# Patient Record
Sex: Male | Born: 1970 | Race: White | Hispanic: No | Marital: Married | Smoking: Never smoker
Health system: Southern US, Community
[De-identification: ages and names within clinical notes are randomized; demographics above are authoritative.]

## PROBLEM LIST (undated history)

## (undated) DIAGNOSIS — I5021 Acute systolic (congestive) heart failure: Secondary | ICD-10-CM

## (undated) DIAGNOSIS — E7800 Pure hypercholesterolemia, unspecified: Secondary | ICD-10-CM

## (undated) DIAGNOSIS — I10 Essential (primary) hypertension: Secondary | ICD-10-CM

## (undated) HISTORY — DX: Acute systolic (congestive) heart failure: I50.21

## (undated) HISTORY — DX: Essential (primary) hypertension: I10

## (undated) HISTORY — PX: OTHER SURGICAL HISTORY: SHX169

## (undated) HISTORY — PX: BLADDER SURGERY: SHX569

---

## 2002-01-11 ENCOUNTER — Emergency Department (HOSPITAL_COMMUNITY): Admission: EM | Admit: 2002-01-11 | Discharge: 2002-01-11 | Payer: Self-pay | Admitting: Emergency Medicine

## 2002-01-11 ENCOUNTER — Encounter: Payer: Self-pay | Admitting: *Deleted

## 2004-03-18 ENCOUNTER — Ambulatory Visit (HOSPITAL_COMMUNITY): Admission: RE | Admit: 2004-03-18 | Discharge: 2004-03-18 | Payer: Self-pay | Admitting: Preventative Medicine

## 2004-12-16 ENCOUNTER — Emergency Department (HOSPITAL_COMMUNITY): Admission: EM | Admit: 2004-12-16 | Discharge: 2004-12-16 | Payer: Self-pay | Admitting: Emergency Medicine

## 2004-12-31 ENCOUNTER — Emergency Department (HOSPITAL_COMMUNITY): Admission: EM | Admit: 2004-12-31 | Discharge: 2004-12-31 | Payer: Self-pay | Admitting: Emergency Medicine

## 2006-01-26 ENCOUNTER — Emergency Department (HOSPITAL_COMMUNITY): Admission: EM | Admit: 2006-01-26 | Discharge: 2006-01-26 | Payer: Self-pay | Admitting: Emergency Medicine

## 2007-03-10 ENCOUNTER — Emergency Department: Payer: Self-pay | Admitting: Emergency Medicine

## 2010-06-30 ENCOUNTER — Emergency Department (HOSPITAL_COMMUNITY): Payer: Self-pay

## 2010-06-30 ENCOUNTER — Inpatient Hospital Stay (HOSPITAL_COMMUNITY)
Admission: EM | Admit: 2010-06-30 | Discharge: 2010-07-02 | Disposition: A | Discharge status: Other Acute Inpatient Hospital | Payer: Self-pay | Source: Home / Self Care

## 2010-06-30 DIAGNOSIS — I10 Essential (primary) hypertension: Secondary | ICD-10-CM | POA: Diagnosis present

## 2010-06-30 DIAGNOSIS — I5021 Acute systolic (congestive) heart failure: Secondary | ICD-10-CM | POA: Diagnosis present

## 2010-06-30 DIAGNOSIS — E785 Hyperlipidemia, unspecified: Secondary | ICD-10-CM | POA: Diagnosis present

## 2010-06-30 DIAGNOSIS — E669 Obesity, unspecified: Secondary | ICD-10-CM | POA: Diagnosis present

## 2010-06-30 DIAGNOSIS — Z79899 Other long term (current) drug therapy: Secondary | ICD-10-CM

## 2010-06-30 DIAGNOSIS — Z91199 Patient's noncompliance with other medical treatment and regimen due to unspecified reason: Secondary | ICD-10-CM

## 2010-06-30 DIAGNOSIS — Z9119 Patient's noncompliance with other medical treatment and regimen: Secondary | ICD-10-CM

## 2010-06-30 DIAGNOSIS — Z7982 Long term (current) use of aspirin: Secondary | ICD-10-CM

## 2010-06-30 DIAGNOSIS — I509 Heart failure, unspecified: Secondary | ICD-10-CM | POA: Diagnosis present

## 2010-06-30 DIAGNOSIS — E876 Hypokalemia: Secondary | ICD-10-CM | POA: Diagnosis present

## 2010-06-30 DIAGNOSIS — I428 Other cardiomyopathies: Secondary | ICD-10-CM | POA: Diagnosis present

## 2010-06-30 DIAGNOSIS — Z889 Allergy status to unspecified drugs, medicaments and biological substances status: Secondary | ICD-10-CM

## 2010-06-30 DIAGNOSIS — I5023 Acute on chronic systolic (congestive) heart failure: Principal | ICD-10-CM | POA: Diagnosis present

## 2010-06-30 LAB — POCT CARDIAC MARKERS
CKMB, poc: <1 ng/mL — ABNORMAL LOW (ref 1.0–8.0)
Myoglobin, poc: 61.2 ng/mL (ref 12–200)
Troponin i, poc: <0.05 ng/mL (ref 0.00–0.09)

## 2010-06-30 LAB — BASIC METABOLIC PANEL
CO2: 24 mEq/L (ref 19–32)
Calcium: 8.7 mg/dL (ref 8.4–10.5)
Chloride: 105 mEq/L (ref 96–112)
Creatinine, Ser: 0.92 mg/dL (ref 0.4–1.5)
Glucose, Bld: 130 mg/dL — ABNORMAL HIGH (ref 70–99)

## 2010-06-30 LAB — CBC
HCT: 46.4 % (ref 39.0–52.0)
MCH: 31.1 pg (ref 26.0–34.0)
MCV: 91.9 fL (ref 78.0–100.0)
Platelets: 170 10*3/uL (ref 150–400)
RDW: 13.8 % (ref 11.5–15.5)

## 2010-06-30 LAB — DIFFERENTIAL
Eosinophils Absolute: 0.2 10*3/uL (ref 0.0–0.7)
Eosinophils Relative: 2 % (ref 0–5)
Lymphocytes Relative: 25 % (ref 12–46)
Lymphs Abs: 2.2 10*3/uL (ref 0.7–4.0)
Monocytes Absolute: 0.5 10*3/uL (ref 0.1–1.0)
Monocytes Relative: 6 % (ref 3–12)

## 2010-06-30 MED ORDER — IOHEXOL 350 MG/ML SOLN
100.0000 mL | Freq: Once | INTRAVENOUS | Status: AC | PRN
  Administered 2010-06-30: 100 mL via INTRAVENOUS

## 2010-07-01 ENCOUNTER — Inpatient Hospital Stay (HOSPITAL_COMMUNITY): Payer: Self-pay

## 2010-07-01 DIAGNOSIS — R0602 Shortness of breath: Secondary | ICD-10-CM

## 2010-07-01 DIAGNOSIS — R079 Chest pain, unspecified: Secondary | ICD-10-CM

## 2010-07-01 DIAGNOSIS — I517 Cardiomegaly: Secondary | ICD-10-CM

## 2010-07-01 LAB — URINALYSIS, ROUTINE W REFLEX MICROSCOPIC
Bilirubin Urine: NEGATIVE
Glucose, UA: NEGATIVE mg/dL
Ketones, ur: NEGATIVE mg/dL
Protein, ur: NEGATIVE mg/dL
pH: 6 (ref 5.0–8.0)

## 2010-07-01 LAB — LIPID PANEL
LDL Cholesterol: 140 mg/dL — ABNORMAL HIGH (ref 0–99)
Total CHOL/HDL Ratio: 7.3 RATIO
Triglycerides: 144 mg/dL (ref <150)
VLDL: 29 mg/dL (ref 0–40)

## 2010-07-01 LAB — CBC
HCT: 43.1 % (ref 39.0–52.0)
Hemoglobin: 14.8 g/dL (ref 13.0–17.0)
MCH: 30.6 pg (ref 26.0–34.0)
MCV: 89 fL (ref 78.0–100.0)
Platelets: 162 10*3/uL (ref 150–400)
RBC: 4.84 MIL/uL (ref 4.22–5.81)
WBC: 10.8 10*3/uL — ABNORMAL HIGH (ref 4.0–10.5)

## 2010-07-01 LAB — T4, FREE: Free T4: 1.01 ng/dL (ref 0.80–1.80)

## 2010-07-01 LAB — RAPID URINE DRUG SCREEN, HOSP PERFORMED
Benzodiazepines: POSITIVE — AB
Cocaine: NOT DETECTED
Opiates: NOT DETECTED

## 2010-07-01 LAB — COMPREHENSIVE METABOLIC PANEL
Albumin: 3.7 g/dL (ref 3.5–5.2)
Alkaline Phosphatase: 50 U/L (ref 39–117)
BUN: 12 mg/dL (ref 6–23)
CO2: 29 mEq/L (ref 19–32)
Chloride: 103 mEq/L (ref 96–112)
Creatinine, Ser: 1.27 mg/dL (ref 0.4–1.5)
GFR calc non Af Amer: >60 mL/min (ref >60)
Potassium: 3.9 mEq/L (ref 3.5–5.1)
Total Bilirubin: 1.2 mg/dL (ref 0.3–1.2)

## 2010-07-01 LAB — BLOOD GAS, ARTERIAL
Bicarbonate: 23.4 mEq/L (ref 20.0–24.0)
O2 Saturation: 92.2 %
Patient temperature: 37
TCO2: 20 mmol/L (ref 0–100)
pH, Arterial: 7.39 (ref 7.350–7.450)
pO2, Arterial: 69.2 mmHg — ABNORMAL LOW (ref 80.0–100.0)

## 2010-07-01 LAB — T3, FREE: T3, Free: 3.4 pg/mL (ref 2.3–4.2)

## 2010-07-01 LAB — DIFFERENTIAL
Lymphocytes Relative: 20 % (ref 12–46)
Lymphs Abs: 2.2 10*3/uL (ref 0.7–4.0)
Monocytes Relative: 8 % (ref 3–12)
Neutro Abs: 7.7 10*3/uL (ref 1.7–7.7)
Neutrophils Relative %: 71 % (ref 43–77)

## 2010-07-01 LAB — HEMOGLOBIN A1C: Hgb A1c MFr Bld: 6.1 % — ABNORMAL HIGH (ref <5.7)

## 2010-07-01 LAB — CARDIAC PANEL(CRET KIN+CKTOT+MB+TROPI)
CK, MB: 1.4 ng/mL (ref 0.3–4.0)
CK, MB: 1.3 ng/mL (ref 0.3–4.0)
Relative Index: INVALID (ref 0.0–2.5)
Relative Index: INVALID (ref 0.0–2.5)
Total CK: 74 U/L (ref 7–232)
Total CK: 70 U/L (ref 7–232)

## 2010-07-01 LAB — MAGNESIUM: Magnesium: 2.1 mg/dL (ref 1.5–2.5)

## 2010-07-01 LAB — URINE MICROSCOPIC-ADD ON

## 2010-07-02 ENCOUNTER — Inpatient Hospital Stay (HOSPITAL_COMMUNITY)
Admission: RE | Admit: 2010-07-02 | Discharge: 2010-07-04 | DRG: 287 | Disposition: A | Discharge status: Home / Self Care | Payer: Self-pay | Source: Other Acute Inpatient Hospital | Attending: Internal Medicine | Admitting: Internal Medicine

## 2010-07-02 DIAGNOSIS — I5021 Acute systolic (congestive) heart failure: Secondary | ICD-10-CM

## 2010-07-02 LAB — LIPID PANEL
Triglycerides: 154 mg/dL — ABNORMAL HIGH (ref <150)
VLDL: 31 mg/dL (ref 0–40)

## 2010-07-02 LAB — BASIC METABOLIC PANEL
BUN: 15 mg/dL (ref 6–23)
CO2: 27 mEq/L (ref 19–32)
Calcium: 9.1 mg/dL (ref 8.4–10.5)
GFR calc non Af Amer: >60 mL/min (ref >60)
Glucose, Bld: 117 mg/dL — ABNORMAL HIGH (ref 70–99)
Sodium: 139 mEq/L (ref 135–145)

## 2010-07-02 LAB — TSH: TSH: 1.046 u[IU]/mL (ref 0.350–4.500)

## 2010-07-02 LAB — HIV ANTIBODY (ROUTINE TESTING W REFLEX): HIV: NONREACTIVE

## 2010-07-02 NOTE — Group Therapy Note (Signed)
  NAME:  Bryan Clark, Bryan Clark NO.:  [id number]  MEDICAL RECORD NO.:  [id number]           PATIENT TYPE:  I  LOCATION:  A336                          FACILITY:  [facility]  PHYSICIAN:  Wilson Singer, M.D.DATE OF BIRTH:  [birth year]  DATE OF PROCEDURE:  07/01/2010 DATE OF DISCHARGE:                                PROGRESS NOTE   This 40 year old man was admitted yesterday with approximately an 1- month history of dyspnea on exertion, 2-week history of orthopnea, and also episodes of paroxysmal nocturnal dyspnea.  He also describes some chest tightening and pressure.  He does have a history of hypertension going back 2-3 years ago, and he was seeing Dr. Renaye Rakers in [place].  He was put on antihypertensive medications, but because of lack of insurance, he and his wife were no longer able to afford medications, and he has not been taking any medications for 1-1/2 years now.  He was admitted with congestive heart failure, which I am fairly sure diastolic in nature and has been diuresed.  He feels better with his breathing now and serial cardiac enzymes so far have been negative. Electrocardiogram shows normal sinus rhythm with poor R-wave progression, and I believe evidence of probably left ventricular hypertrophy.  He does have a family history of hypertension in his mother and his sister.  He has never had any coronary artery disease and stroke.  PHYSICAL EXAMINATION:  VITAL SIGNS:  Temperature 97.9, blood pressure 131/83, pulse 68, saturation 95% on room air. GENERAL:  He looks systemically well.  He is not in any respiratory distress.  There is no increased work of breathing.  CARDIOVASCULAR: Heart sounds are present without any gallop rhythm.  Jugular venous pressure is not raised. LUNGS:  There is no peripheral pitting edema.  Lung fields show some inspiratory crackles at both bases.  He clinically does not have any pleural effusions, although on a CT chest  angiogram yesterday he did, and I think these are probably resolving now.  There is no peripheral pitting edema.  Investigations today showed a hemoglobin of 14.8, white blood cell count 10.8, platelets 162.  Sodium 140, potassium 3.9, bicarbonate 29, BUN 12, creatinine 1.27.  BNP 430.  Troponins so far less than 0.05 and 0.05.  IMPRESSION: 1. Congestive heart failure, likely diastolic. 2. Obesity.  The patient is currently about 70 pounds overweight     compared to his baseline.  PLAN: 1. I will switch him now to p.o. Lasix. 2. Start amlodipine as a calcium channel blockers medication of choice     for his hypertension and diastolic heart failure. 3. Await echocardiogram report. 4. Possible discharge home tomorrow. We talked at length about diet and exercise and the real urgency for this man to lose at least 50 pounds over the next year or so.  We talked about strategies of doing this.     Wilson Singer, M.D.     NCG/MEDQ  D:  07/01/2010  T:  07/01/2010  Job:  [id number]  Electronically Signed by Lilly Cove M.D. on 07/02/2010 08:08:12 AM

## 2010-07-03 DIAGNOSIS — I509 Heart failure, unspecified: Secondary | ICD-10-CM

## 2010-07-03 LAB — BASIC METABOLIC PANEL
CO2: 29 mEq/L (ref 19–32)
Calcium: 9.2 mg/dL (ref 8.4–10.5)
Creatinine, Ser: 1.38 mg/dL (ref 0.4–1.5)
GFR calc Af Amer: >60 mL/min (ref >60)
GFR calc non Af Amer: 57 mL/min — ABNORMAL LOW (ref >60)
Glucose, Bld: 128 mg/dL — ABNORMAL HIGH (ref 70–99)
Sodium: 138 mEq/L (ref 135–145)

## 2010-07-03 LAB — POCT I-STAT 3, ART BLOOD GAS (G3+)
Acid-Base Excess: 1 mmol/L (ref 0.0–2.0)
O2 Saturation: 93 %
pCO2 arterial: 41 mmHg (ref 35.0–45.0)
pO2, Arterial: 68 mmHg — ABNORMAL LOW (ref 80.0–100.0)

## 2010-07-03 LAB — POCT I-STAT 3, VENOUS BLOOD GAS (G3P V)
pCO2, Ven: 45 mmHg (ref 45.0–50.0)
pH, Ven: 7.335 — ABNORMAL HIGH (ref 7.250–7.300)
pO2, Ven: 32 mmHg (ref 30.0–45.0)

## 2010-07-03 LAB — PROTIME-INR: Prothrombin Time: 14.6 seconds (ref 11.6–15.2)

## 2010-07-04 DIAGNOSIS — I5023 Acute on chronic systolic (congestive) heart failure: Secondary | ICD-10-CM

## 2010-07-04 LAB — BASIC METABOLIC PANEL
CO2: 27 mEq/L (ref 19–32)
Chloride: 100 mEq/L (ref 96–112)
GFR calc Af Amer: >60 mL/min (ref >60)
Potassium: 3.5 mEq/L (ref 3.5–5.1)

## 2010-07-04 NOTE — Cardiovascular Report (Signed)
  NAME:  Bryan Clark, Bryan Clark NO.:  [id number]  MEDICAL RECORD NO.:  [id number]           PATIENT TYPE:  I  LOCATION:  3708                         FACILITY:  [facility]  PHYSICIAN:  Noralyn Pick. Eden Emms, MD, FACCDATE OF BIRTH:  [birth year]  DATE OF PROCEDURE: DATE OF DISCHARGE:                           CARDIAC CATHETERIZATION   PROCEDURE:  Coronary arteriography.  INDICATION:  Congestive heart failure.  Standard catheterization was done from the right femoral artery and vein.  Left main coronary artery was normal.  Left anterior descending artery was normal.  There were two very small diagonal branches, which were normal.  Circumflex coronary artery was nondominant.  He had an extremely large branching first obtuse marginal branch, which also supplied the anterolateral wall.  There was a single AV groove branch.  Circumflex coronary artery was normal.  The right coronary artery was dominant and normal.  RAO ventriculography:  RAO ventriculography showed global hypokinesis with severe left ventricular cavity enlargement.  There was mild MR, ejection fraction was in the 20% range.  Right heart catheterization was done due to clinical heart failure.  Mean right atrial pressure was 13.  RV pressure was 49/13, PA pressure was 49/27 with a mean of 34.  Mean pulmonary capillary wedge pressure was 32.  LV pressure was 130/24 with a post A-wave EDP of 31.  Aortic pressure was 130/89.  IMPRESSION:  The patient has severe nonischemic cardiomyopathy.  He still has somewhat high filling pressures.  Continued aggressive medical therapy is warranted.  The patient tolerated the procedure well.     Noralyn Pick. Eden Emms, MD, [facility]     PCN/MEDQ  D:  07/03/2010  T:  07/03/2010  Job:  [id number]  cc:   [facility] Gerrit Friends. Dietrich Pates, MD, [facility]  Electronically Signed by Charlton Haws MD [facility] on 07/04/2010 11:31:17 AM

## 2010-07-10 NOTE — Discharge Summary (Signed)
NAME:  Bryan Clark, Bryan Clark NO.:  [id number]  MEDICAL RECORD NO.:  [id number]           PATIENT TYPE:  I  LOCATION:  3708                         FACILITY:  [facility]  PHYSICIAN:  Hartley Barefoot, MD    DATE OF BIRTH:  June 23, 1970  DATE OF ADMISSION:  07/02/2010 DATE OF DISCHARGE:  07/04/2010                              DISCHARGE SUMMARY   DISCHARGE DIAGNOSES: 1. Nonischemic cardiomyopathy, ejection fraction 20% by cardiac cath. 2. Hypertensive urgency. 3. Acute systolic heart failure exacerbation. 4. Bilateral pleural effusion, likely secondary to heart failure. 5. Hyperlipidemia.  DISCHARGE MEDICATIONS: 1. Aspirin 81 mg p.o. daily. 2. Carvedilol 12.5 mg p.o. b.i.d. 3. Lasix 40 mg p.o. daily. 4. Lisinopril 10 mg p.o. daily. 5. Potassium 20 mEq 1 tablet by mouth daily. 6. Spironolactone 25 mg p.o. daily.  DISPOSITION AND FOLLOWUP:  Mr. Maiello will need to follow up with [facility] Cardiology at their [place] office next week.  He need to have a BMET to follow potassium level and renal function now that he is on his spironolactone, Lasix, and ACE.  He will need also repeat 2-D echo in 12 weeks to evaluate ejection fraction, may need to consider ICD if ejection fraction less than 35%.  PROCEDURE PERFORMED:  Cardiac cath showed severe nonischemic cardiomyopathy.  BRIEF HISTORY OF PRESENT ILLNESS:  This is a 40 year old with past medical history of hypertension who was admitted at [facility] on June 30, 2010, for shortness of breath and chest pain.  He has been having chest pain and shortness of breath for over a month, progressively got worse.  HOSPITAL COURSE: 1. Acute Systolic heart failure exacerbation. The patient was admitted to     [facility] telemetry. Cardiology was consulted. He was started     on Lasix spironolactone and ACE inhibitors.     He had a 2-D echo that showed systolic heart failure with ejection     fraction of 35%.  He was then  transferred to [facility] for cardiac     cath and for evaluation.  Cardiac cath showed nonischemic     cardiomyopathy, ejection fraction of 20%.  The patient now is     euvolemic, no chest pain or shortness of breath.  He will need to     be on aggressive medical management.  He will be discharged on     Coreg, lisinopril, and Aldactone.  He will need a 2-D echo in 12     weeks to evaluate ejection fraction and consider ICD placement.     He will need to follow with Cardiology at [facility] next week for     a BMET. 2. Hypertensive urgency.  The patient was not taking his medication.     After he was started on oral medication, his blood pressure has     been well controlled. 3. Bilateral pleural effusion.  This was likely secondary to heart     failure.  He will need a chest x-ray to follow resolution of the     effusion after aggressive diuresis.  On the day of discharge, the patient was in improved condition.  Blood pressure 120/81, saturation 97 on room air, respirations 19, pulse 76, and temperature 98.0.  DISCHARGE LABORATORY DATA:  Sodium 140, potassium 3.5, chloride 100, bicarb 27, glucose 97, BUN 18, and creatinine 1.5.  TSH was normal at 1.0.  LDL was 141.  HIV was negative.  The patient was discharged in improved condition.  The patient to follow up with Dr. Parke Simmers.     Hartley Barefoot, MD     BR/MEDQ  D:  07/04/2010  T:  07/05/2010  Job:  [id number]  cc:   Renaye Rakers, M.D.  Electronically Signed by Hartley Barefoot MD on 07/10/2010 07:23:19 PM

## 2010-07-11 ENCOUNTER — Encounter: Payer: Self-pay | Admitting: Adult Health

## 2010-07-11 ENCOUNTER — Ambulatory Visit (INDEPENDENT_AMBULATORY_CARE_PROVIDER_SITE_OTHER): Payer: Self-pay | Admitting: Adult Health

## 2010-07-11 DIAGNOSIS — I10 Essential (primary) hypertension: Secondary | ICD-10-CM

## 2010-07-11 DIAGNOSIS — E781 Pure hyperglyceridemia: Secondary | ICD-10-CM

## 2010-07-11 DIAGNOSIS — I5022 Chronic systolic (congestive) heart failure: Secondary | ICD-10-CM

## 2010-07-11 DIAGNOSIS — I428 Other cardiomyopathies: Secondary | ICD-10-CM

## 2010-07-11 DIAGNOSIS — E7800 Pure hypercholesterolemia, unspecified: Secondary | ICD-10-CM

## 2010-07-11 MED ORDER — CARVEDILOL 12.5 MG PO TABS: 18.7500 mg | ORAL_TABLET | Freq: Two times a day (BID) | ORAL | Status: DC

## 2010-07-11 NOTE — Patient Instructions (Signed)
Your physician recommends that you schedule a follow-up appointment in:1 month Your physician has recommended you make the following change in your medication: carvedilol 12.5mg  take 1 1/2 tablets by mouth twice daily Low salt diet: handout Congestive heart failure: handout Your physician recommends that you weigh, daily, at the same time every day, and in the same amount of clothing. Please record your daily weights on the handout provided and bring it to your next appointment.

## 2010-07-11 NOTE — Assessment & Plan Note (Signed)
Will recheck this in 3 months.  If elevated after diet and lifestyle changes will begin statin.

## 2010-07-11 NOTE — Progress Notes (Signed)
HPI:  40 y/o CM we are following after recent consultation at [facility] and subsequent transfer to [facility], after admission for progressive dyspnea.  He has a history of hypertension, hyperlipidemia, and medical noncompliance secondary to financial issues. During [facility] admission an echo was obtained and he was found to have EF of 35%.  He was transferred to [facility] for cardiac catheterization which demonstrated normal coronary arteries but severe nonischemic cardiomyopathy.  He was subsequently placed on ASA 81mg  daily, coreg 12.5mg  BID, Lasix 40mg  daily, Lisinopril 10mg  daily, spironolactone 25mg  daily and potassium daily.  He is without complaint today. He has been complaint with medcations. He has lost a total of 14lbs with diureses during hospitalization.  He is following a low salt diet. He is not weighing himself daily at this time.    No Known Allergies  Current Outpatient Prescriptions  Medication Sig Dispense Refill  . aspirin 81 MG tablet Take 81 mg by mouth daily.        . carvedilol (COREG) 12.5 MG tablet Take 1.5 tablets (18.75 mg total) by mouth 2 (two) times daily with a meal.  45 tablet  3  . furosemide (LASIX) 40 MG tablet Take 40 mg by mouth daily.        Marland Kitchen lisinopril (PRINIVIL,ZESTRIL) 10 MG tablet Take 10 mg by mouth daily.        . potassium chloride SA (K-DUR,KLOR-CON) 20 MEQ tablet Take 20 mEq by mouth daily.        Marland Kitchen spironolactone (ALDACTONE) 25 MG tablet Take 25 mg by mouth daily.        Marland Kitchen DISCONTD: carvedilol (COREG) 12.5 MG tablet Take 12.5 mg by mouth 2 (two) times daily with a meal.          Past Medical History  Diagnosis Date  . Cardiomyopathy     nonischemic ejection fraction 20% by cardiac cath  . Hypertension   . Acute systolic heart failure     exacerbation    Past Surgical History  Procedure Date  . Bilaterial pleural effusion   . Bladder surgery     trauma as a child during martial arts training    ROS: Review of systems complete and found to be negative  unless listed above PHYSICAL EXAM BP 103/74  Pulse 66  Wt 225 lb (102.059 kg)  SpO2 96% General: Well developed, well nourished, in no acute distress Head: Eyes PERRLA, No xanthomas.   Normal cephalic and atramatic  Lungs: Clear bilaterally to auscultation and percussion. Heart: HRRR S1 S2  Pulses are 2+ & equal.            No carotid bruit. No JVD.  No abdominal bruits. No femoral bruits. Abdomen: Bowel sounds are positive, abdomen soft and non-tender without masses or                  Hernia's noted. Msk:  Back normal, normal gait. Normal strength and tone for age. Extremities: No clubbing, cyanosis or edema.  DP +1 Neuro: Alert and oriented X 3. Psych:  Good affect, responds appropriately

## 2010-07-11 NOTE — Assessment & Plan Note (Signed)
Currently modestly controlled with low EF.  I would like to see systolic in the 90's, no higher than 100.  I will increase his coreg to 18.75 mg BID.

## 2010-07-11 NOTE — Assessment & Plan Note (Signed)
He is currently without complaints. Wt is down from 107 kg to 102 kg.  He is walking everyday.  He denies symptoms. He is complaint with medications.  I have advised him to buy a digital scale, weigh daily at the same time every day. He is given instructions on a low salt diet.  He is to call us for weight gain of 2-3 lbs in 1-2 days or 5 lbs in one week.  I will check a BMET to evaluate kidney fx with use of ACE inhibitor, spironolactone, lasix and potassium.  Coreg will be increased to 18.75 mg BID.  He will see [place] in one month.

## 2010-07-12 LAB — BASIC METABOLIC PANEL
BUN: 28 mg/dL — ABNORMAL HIGH (ref 6–23)
CO2: 25 mEq/L (ref 19–32)
Chloride: 100 mEq/L (ref 96–112)
Glucose, Bld: 93 mg/dL (ref 70–99)
Potassium: 4.9 mEq/L (ref 3.5–5.3)
Sodium: 137 mEq/L (ref 135–145)
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
## 2010-07-20 NOTE — H&P (Signed)
NAME:  Bryan Clark, Bryan Clark NO.:  [id number]  MEDICAL RECORD NO.:  [id number]           PATIENT TYPE:  E  LOCATION:  [facility]                          FACILITY:  [facility]  PHYSICIAN:  Eduard Clos, MDDATE OF BIRTH:  11/30/1970  DATE OF ADMISSION:  06/30/2010 DATE OF DISCHARGE:  LH                             HISTORY & PHYSICAL   PRIMARY CARE PHYSICIAN:  Unassigned.  The patient used to go to Dr. Renaye Rakers, has not gone for the last 1-1/2 years.  CHIEF COMPLAINT:  Shortness of breath and chest pain.  HISTORY OF PRESENT ILLNESS:  A 40 year old male with previous history of hypertension, hyperlipidemia, has not been taking his medications for a year and a half because of financial issues.  He has been experiencing shortness of breath and chest pain over the last 1 month.  The patient usually works in the third shift in the evenings and whenever he goes to start working, he starts developing some chest pain which is retrosternal and radiates across the chest.  It is a pressure-like. Along with that, he also started developing shortness of breath.  Last night he became suddenly short of breath and he was sleeping and had to wake up in the middle of the sleep gasping for breath which actually brought him to the ER.  In the ER, the patient had a CT angio of chest which showed a bilateral pleural effusion, right more than left.  I did discuss with radiologist who said the effusion is small to moderate, not huge.  At this time, the patient has been admitted for chest pain, shortness of breath, and possible decompensated CHF.  The patient denies any nausea, vomiting, abdominal pain, dysuria, discharge, diarrhea, any headache, any focal deficits, loss of consciousness.  He does get short of breath when he walks and also when he lies down flat for long time.  He has not noticed any swelling in his legs.  Denies any fever or chills.  Does have dry cough.  No phlegm.  PAST  MEDICAL HISTORY:  Hypertension, hyperlipidemia, obesity.  PAST SURGICAL HISTORY:  He has had surgery for his bladder from a trauma as a child during martial art training.  MEDICATIONS ON ADMISSION:  None.  He used to be on Azor a year and a half ago along with TriCor.  ALLERGIES:  No known drug allergies.  FAMILY HISTORY:  Significant for hypertension and diabetes.  SOCIAL HISTORY:  The patient works as a Electrical engineer.  He is a full code.  He is married.  Denies any serious smoking, drinking alcohol, or abusing drugs.  REVIEW OF SYSTEMS:  As per history of present illness, nothing else significant.  PHYSICAL EXAMINATION:  GENERAL:  The patient examined at bedside, not in acute distress. VITAL SIGNS:  Blood pressure is 180/120, pulse is around 115 per minute, temperature 98, respirations 18 per minute, O2 sat is 97%. HEENT:  Anicteric.  No pallor.  No discharge from ears, eyes, nose, or mouth. CHEST:  Bilateral air entry present.  No rhonchi, no crepitation. HEART:  S1, S2  heard. ABDOMEN:  Soft, nontender.  Bowel sounds heard. CNS:  Alert, awake, oriented to time, place, and person.  Moves upper and lower extremities 5/5. EXTREMITIES:  Peripheral pulses felt.  No edema.  LABORATORY DATA:  EKG shows normal sinus rhythm with poor R-wave progression with nonspecific ST-T changes.  Heart rate is around 107 beats per minute.  Chest x-ray shows enlargement of the pericardial silhouette.  Cardiomegaly or pericardial effusion could have this appearance.  CT angio chest shows cardiomegaly with bilateral pleural effusions right greater than left.  No pulmonary emboli.  I did discuss this CT angio finding with radiology who said there was no pericardial effusion.  CBC:  WBC is 8.8, hemoglobin 15.7, hematocrit is 46.4, platelets 170,000.  D-dimer is 0.62.  Basic metabolic panel:  Sodium 139, potassium 3.6, chloride 105, carbon dioxide 24, glucose 130, BUN 12, creatinine 0.9, calcium  8.7, CK-MB less than 1, troponin less than 0.05, myoglobin 1.2, BNP is 439.  ASSESSMENT: 1. Shortness of breath most likely from decompensated congestive heart     failure. 2. Decompensated congestive heart failure most likely from axillary     hypertension and diastolic heart failure. 3. Bilateral pleural effusion, right greater than left most likely     from decompensated congestive failure. 4. Chest pain. 5. History of hyperlipidemia. 6. Obesity.  PLAN: 1. At this time admit the patient to telemetry. 2. For his shortness of breath which is most likely from decompensated     CHF which could be from diastolic heart failure and also axillary     hypertension contributing to that, the patient has already received     Lasix 40 mg and 2 more doses of 60 mg IV Lasix q.8 h. after which     we may decide to change it to p.o. or IV to be continued based on     his clinical status, volume status, and labs.  I am going to place     him on nitroglycerin paste.  Presently the patient is chest pain     free.  We will place the patient on aspirin.  I am also going to     add labetalol 100 mg p.o. b.i.d. 1 dose now.  We are going to get 2-     D echo.  We will also get a cardiology consult in a.m. 3. We are going to cycle cardiac markers and going to recheck BNP and     his creatinine and potassium in the morning. 4. The patient is tachycardic that increased from his decompensated     CHF but I am going to check stat thyroid function test. 5. We are also going to check a UA and liver function test to make     sure there is no other causes causing his fluid     retention, particularly any liver or renal problems. 6. We will also check hemoglobin A1c. 7. Further recommendation as condition evolves.     Eduard Clos, MD     ANK/MEDQ  D:  06/30/2010  T:  06/30/2010  Job:  [id number]  Electronically Signed by Midge Minium MD on 07/20/2010 08:21:52 AM

## 2010-07-20 NOTE — H&P (Signed)
  NAME:  JEFFEY, JANSSEN NO.:  [id number]  MEDICAL RECORD NO.:  [id number]           PATIENT TYPE:  E  LOCATION:  [facility]                          FACILITY:  [facility]  PHYSICIAN:  Eduard Clos, MDDATE OF BIRTH:  1970-03-25  DATE OF ADMISSION:  06/30/2010 DATE OF DISCHARGE:  LH                             HISTORY & PHYSICAL   ADDENDUM  In addition to the already dictated history and physical, the patient does have bilateral pleural effusion.  At this time, the patient is not in any respiratory distress.  At this time, we will try Lasix IV to see if his pleural effusion improves, for which I am also ordering a portable chest x-ray in the a.m.  If his pleural effusion does not improve and if he is having worsening shortness of breath, at that time we may consider thoracentesis, but this time I do not think he clinically requires this.     Eduard Clos, MD     ANK/MEDQ  D:  06/30/2010  T:  06/30/2010  Job:  [id number]  Electronically Signed by Midge Minium MD on 07/20/2010 08:21:44 AM

## 2010-08-08 ENCOUNTER — Ambulatory Visit (INDEPENDENT_AMBULATORY_CARE_PROVIDER_SITE_OTHER): Payer: Self-pay | Admitting: Adult Health

## 2010-08-08 ENCOUNTER — Encounter: Payer: Self-pay | Admitting: Adult Health

## 2010-08-08 VITALS — BP 119/75 | HR 56 | Wt 222.0 lb

## 2010-08-08 DIAGNOSIS — I5022 Chronic systolic (congestive) heart failure: Secondary | ICD-10-CM

## 2010-08-08 DIAGNOSIS — I428 Other cardiomyopathies: Secondary | ICD-10-CM

## 2010-08-08 DIAGNOSIS — I10 Essential (primary) hypertension: Secondary | ICD-10-CM

## 2010-08-08 DIAGNOSIS — E7800 Pure hypercholesterolemia, unspecified: Secondary | ICD-10-CM

## 2010-08-08 NOTE — Patient Instructions (Signed)
**Note De-Identified Bryan Clark Obfuscation** Your physician has recommended you make the following change in your medication: stop taking Potassium  Your physician has requested that you have an echocardiogram. Echocardiography is a painless test that uses sound waves to create images of your heart. It provides your doctor with information about the size and shape of your heart and how well your heart's chambers and valves are working. This procedure takes approximately one hour. There are no restrictions for this procedure.  Your physician recommends that you return for lab work in: 3 months, just before next office visit

## 2010-08-08 NOTE — Assessment & Plan Note (Signed)
Will plan lipids and LFT's with next blood drawn in 3 months.

## 2010-08-08 NOTE — Progress Notes (Signed)
HPI:  40 y/o CM we are following after recent consultation at [facility] and subsequent transfer to [facility], after admission for progressive dyspnea.  He has a history of hypertension, hyperlipidemia, and medical noncompliance secondary to financial issues. During [facility] admission an echo was obtained and he was found to have EF of 35%.  He was transferred to [facility] for cardiac catheterization which demonstrated normal coronary arteries but severe nonischemic cardiomyopathy.  He was subsequently placed on ASA 81mg  daily, coreg 12.5mg  BID, Lasix 40mg  daily, Lisinopril 10mg  daily, spironolactone 25mg  daily and potassium daily.  He is without complaint today. He has been complaint with medcations. He has lost a total of 14lbs with diureses during hospitalization.  He is following a low salt diet.   Since being seen last he has lost an additional 8 lbs. His wife and he are weighing daily and she is recording all of his wts and BP.  They are staying WNL with persistent wt loss due to calorie restriction and low salt.  He is happy and without complaint of shortness of breath, chest pain or dizziness.  No Known Allergies  Current Outpatient Prescriptions  Medication Sig Dispense Refill  . aspirin 81 MG tablet Take 81 mg by mouth daily.        . carvedilol (COREG) 12.5 MG tablet Take 1.5 tablets (18.75 mg total) by mouth 2 (two) times daily with a meal.  45 tablet  3  . furosemide (LASIX) 40 MG tablet Take 40 mg by mouth daily.        Marland Kitchen lisinopril (PRINIVIL,ZESTRIL) 10 MG tablet Take 10 mg by mouth daily.        Marland Kitchen spironolactone (ALDACTONE) 25 MG tablet Take 25 mg by mouth daily.        Marland Kitchen DISCONTD: potassium chloride SA (K-DUR,KLOR-CON) 20 MEQ tablet Take 20 mEq by mouth daily.          Past Medical History  Diagnosis Date  . Cardiomyopathy     nonischemic ejection fraction 20% by cardiac cath  . Hypertension   . Acute systolic heart failure     exacerbation    Past Surgical History  Procedure Date  .  Bilaterial pleural effusion   . Bladder surgery     trauma as a child during martial arts training    ROS: Review of systems complete and found to be negative unless listed above PHYSICAL EXAM BP 119/75  Pulse 56  Wt 222 lb (100.699 kg)  SpO2 97% General: Well developed, well nourished, in no acute distress Head: Eyes PERRLA, No xanthomas.   Normal cephalic and atramatic  Lungs: Clear bilaterally to auscultation and percussion. Heart: HRRR S1 S2  Pulses are 2+ & equal.            No carotid bruit. No JVD.  No abdominal bruits. No femoral bruits. Abdomen: Bowel sounds are positive, abdomen soft and non-tender without masses or                  Hernia's noted. Msk:  Back normal, normal gait. Normal strength and tone for age. Extremities: No clubbing, cyanosis or edema.  DP +1 Neuro: Alert and oriented X 3. Psych:  Good affect, responds appropriately

## 2010-08-08 NOTE — Assessment & Plan Note (Signed)
I am very pleased with his progress. He is adhering to all recommendations and is physically more active. He plans to lose about 15-20 lbs.  Review of his labs demonstrate high normal K+4.0 with Creat 1.4 (stable).  I will discontinue potassium supplements as he is on spironolactone along with his lasix.  We will see him in 3 months with follow-up BMET and echo.

## 2010-10-24 ENCOUNTER — Other Ambulatory Visit: Payer: Self-pay | Admitting: Adult Health

## 2010-11-08 LAB — BASIC METABOLIC PANEL
BUN: 22 mg/dL (ref 6–23)
CO2: 25 mEq/L (ref 19–32)
Calcium: 9.2 mg/dL (ref 8.4–10.5)
Creat: 1.22 mg/dL (ref 0.50–1.35)
Glucose, Bld: 124 mg/dL — ABNORMAL HIGH (ref 70–99)

## 2010-11-10 ENCOUNTER — Other Ambulatory Visit (HOSPITAL_COMMUNITY): Payer: Self-pay

## 2010-11-11 ENCOUNTER — Ambulatory Visit (HOSPITAL_COMMUNITY)
Admission: RE | Admit: 2010-11-11 | Discharge: 2010-11-11 | Disposition: A | Discharge status: Home / Self Care | Payer: Self-pay | Source: Ambulatory Visit | Attending: Cardiology | Admitting: Cardiology

## 2010-11-11 DIAGNOSIS — I509 Heart failure, unspecified: Secondary | ICD-10-CM | POA: Insufficient documentation

## 2010-11-11 DIAGNOSIS — I10 Essential (primary) hypertension: Secondary | ICD-10-CM | POA: Insufficient documentation

## 2010-11-11 DIAGNOSIS — E7800 Pure hypercholesterolemia, unspecified: Secondary | ICD-10-CM | POA: Insufficient documentation

## 2010-11-11 NOTE — Progress Notes (Signed)
*  PRELIMINARY RESULTS* Echocardiogram 2D Echocardiogram has been performed.  Conrad Viola 11/11/2010, 11:01 AM

## 2010-11-14 ENCOUNTER — Encounter: Payer: Self-pay | Admitting: *Deleted

## 2010-11-14 ENCOUNTER — Ambulatory Visit (INDEPENDENT_AMBULATORY_CARE_PROVIDER_SITE_OTHER): Payer: Self-pay | Admitting: Physician Assistant

## 2010-11-14 ENCOUNTER — Encounter: Payer: Self-pay | Admitting: Physician Assistant

## 2010-11-14 DIAGNOSIS — I428 Other cardiomyopathies: Secondary | ICD-10-CM

## 2010-11-14 DIAGNOSIS — I10 Essential (primary) hypertension: Secondary | ICD-10-CM

## 2010-11-14 MED ORDER — FUROSEMIDE 40 MG PO TABS: 40.0000 mg | ORAL_TABLET | Freq: Every day | ORAL | Status: DC

## 2010-11-14 MED ORDER — CARVEDILOL 12.5 MG PO TABS: 18.7500 mg | ORAL_TABLET | Freq: Two times a day (BID) | ORAL | Status: DC

## 2010-11-14 MED ORDER — LISINOPRIL 10 MG PO TABS: 10.0000 mg | ORAL_TABLET | Freq: Every day | ORAL | Status: DC

## 2010-11-14 NOTE — Assessment & Plan Note (Signed)
His  blood pressure is on the low side. We will stop his Aldactone. His wife will continue to monitor his blood pressure checks and let us know if they go up.

## 2010-11-14 NOTE — Patient Instructions (Signed)
Your physician recommends that you schedule a follow-up appointment in: 2-3 months  Your physician has recommended you make the following change in your medication:  STOP Spironolactone

## 2010-11-14 NOTE — Assessment & Plan Note (Addendum)
Patient has history of nonischemic cardiomyopathy but 2-D echo on 11/11/2010 shows complete resolution of LV dysfunction with normal LV function. We will stop the patient's Aldactone because of low blood pressures. Patient may return to work full time.

## 2010-11-14 NOTE — Progress Notes (Signed)
HPI: This is a 40 year old white male patient who was recently diagnosed with a nonischemic cardiomyopathy possibly due to hypertension and noncompliance. His ejection fraction was 35% and a recent cardiac catheterization revealed normal coronary arteries. He has been on medications for several months and trying to lose weight.  Overall he feels well without chest pain, palpitations, dyspnea, dyspnea on exertion, dizziness, or presyncope. He is trying to lose weight but has struggled over the past several months. 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  Recent 2-D echo on 11/11/2010 revealed complete resolution of LV dysfunction with normal LV size and function.  Blood pressures from home are fairly low in the 90-100 systolic range.  No Known Allergies  Current Outpatient Prescriptions on File Prior to Visit  Medication Sig Dispense Refill  . aspirin 81 MG tablet Take 81 mg by mouth daily.        Marland Kitchen DISCONTD: carvedilol (COREG) 12.5 MG tablet Take 1.5 tablets (18.75 mg total) by mouth 2 (two) times daily with a meal.  45 tablet  3    Past Medical History  Diagnosis Date  . Cardiomyopathy     nonischemic ejection fraction 20% by cardiac cath  . Hypertension   . Acute systolic heart failure     exacerbation    Past Surgical History  Procedure Date  . Bilaterial pleural effusion   . Bladder surgery     trauma as a child during martial arts training    No family history on file.  History   Social History  . Marital Status: Married    Spouse Name: N/A    Number of Children: N/A  . Years of Education: N/A   Occupational History  . full time     security guard   Social History Main Topics  . Smoking status: Never Smoker   . Smokeless tobacco: Never Used  . Alcohol Use: No  . Drug Use: No  . Sexually Active: Not on file   Other Topics Concern  . Not on file   Social History Narrative  . No narrative on file    ROS: See HPI Eyes:  Negative Ears:Negative for hearing loss, tinnitus Cardiovascular: Negative for chest pain, palpitations,irregular heartbeat, dyspnea, dyspnea on exertion, near-syncope, orthopnea, paroxysmal nocturnal dyspnia and syncope,edema, claudication, cyanosis,.  Respiratory:   Negative for cough, hemoptysis, shortness of breath, sleep disturbances due to breathing, sputum production and wheezing.   Endocrine: Negative for cold intolerance and heat intolerance.  Hematologic/Lymphatic: Negative for adenopathy and bleeding problem. Does not bruise/bleed easily.  Musculoskeletal: Negative.   Gastrointestinal: Negative for nausea, vomiting, reflux, abdominal pain, diarrhea, constipation.   Neurological: Negative.  Allergic/Immunologic: Negative for environmental allergies.   PHYSICAL EXAM: Overweight, in no acute distress. Neck: No JVD, HJR, Bruit, or thyroid enlargement Lungs: No tachypnea, clear without wheezing, rales, or rhonchi Cardiovascular: RRR, PMI not displaced, heart sounds normal, no murmurs, gallops, bruit, thrill, or heave. Abdomen: BS normal. Soft without organomegaly, masses, lesions or tenderness. Extremities: without cyanosis, clubbing or edema. Good distal pulses bilateral SKin: Warm, no lesions or rashes  Musculoskeletal: No deformities Neuro: no focal signs  BP 130/85  Pulse 78  Resp 18  Ht 5\' 9"  (1.753 m)  Wt 227 lb (102.967 kg)  BMI 33.52 kg/m2  SpO2 98%

## 2010-11-17 ENCOUNTER — Telehealth: Payer: Self-pay | Admitting: *Deleted

## 2010-11-17 NOTE — Telephone Encounter (Signed)
Left message for pt of normal lab results Bryan Clark  

## 2011-01-14 ENCOUNTER — Ambulatory Visit: Payer: Self-pay | Admitting: Cardiology

## 2011-06-22 ENCOUNTER — Telehealth: Payer: Self-pay | Admitting: Cardiology

## 2011-06-22 MED ORDER — LISINOPRIL 10 MG PO TABS: 10.0000 mg | ORAL_TABLET | Freq: Every day | ORAL | Status: DC

## 2011-06-22 MED ORDER — FUROSEMIDE 40 MG PO TABS: 40.0000 mg | ORAL_TABLET | Freq: Every day | ORAL | Status: DC

## 2011-06-22 MED ORDER — CARVEDILOL 12.5 MG PO TABS: 18.7500 mg | ORAL_TABLET | Freq: Two times a day (BID) | ORAL | Status: DC

## 2011-06-22 NOTE — Telephone Encounter (Signed)
PT HAS MEADE APPT TO SEE DR Dietrich Pates 07/14/11. HE IS OUT OF CARVEDILOL, LISINOPRIL AND FUROSEMIDE.

## 2011-07-14 ENCOUNTER — Ambulatory Visit: Payer: Self-pay | Admitting: Cardiology

## 2011-07-15 ENCOUNTER — Ambulatory Visit: Payer: Self-pay | Admitting: Cardiology

## 2011-07-15 ENCOUNTER — Encounter: Payer: Self-pay | Admitting: Internal Medicine

## 2011-07-15 ENCOUNTER — Ambulatory Visit (INDEPENDENT_AMBULATORY_CARE_PROVIDER_SITE_OTHER): Payer: BC Managed Care – PPO | Admitting: Internal Medicine

## 2011-07-15 VITALS — BP 124/81 | HR 71 | Resp 18 | Ht 70.0 in | Wt 233.0 lb

## 2011-07-15 DIAGNOSIS — I10 Essential (primary) hypertension: Secondary | ICD-10-CM

## 2011-07-15 DIAGNOSIS — E7800 Pure hypercholesterolemia, unspecified: Secondary | ICD-10-CM

## 2011-07-15 LAB — LIPID PANEL: HDL: 35 mg/dL — ABNORMAL LOW (ref >39)

## 2011-07-15 LAB — BASIC METABOLIC PANEL
CO2: 30 mEq/L (ref 19–32)
Calcium: 9.5 mg/dL (ref 8.4–10.5)
Chloride: 100 mEq/L (ref 96–112)
Sodium: 139 mEq/L (ref 135–145)

## 2011-07-15 NOTE — Patient Instructions (Signed)
**Note De-Identified Bryan Clark Obfuscation** Your physician recommends that you return for lab work in: today  Your physician recommends that you continue on your current medications as directed. Please refer to the Current Medication list given to you today.  Your physician recommends that you schedule a follow-up appointment in: 9 months

## 2011-07-15 NOTE — Progress Notes (Signed)
HPI Patient is a 41 year old with a history of NICM poss due to HTN.  LVEF 35%.  Cardiac catheterization showed no CAD. Repeat echo in September 2012 showed normalization of LV function.  Last seen in clinic in September 2012 by Leda Gauze. Since seen he had called in saying he was out of meds.  Had been out for 1 month Breathing has good.  No CP.No edema.   Now doing martial arts.   Trying to get back to full activity. No Known Allergies  Current Outpatient Prescriptions  Medication Sig Dispense Refill  . aspirin 81 MG tablet Take 81 mg by mouth daily.        . carvedilol (COREG) 12.5 MG tablet Take 1.5 tablets (18.75 mg total) by mouth 2 (two) times daily with a meal.  90 tablet  6  . furosemide (LASIX) 40 MG tablet Take 1 tablet (40 mg total) by mouth daily.  30 tablet  12  . lisinopril (PRINIVIL,ZESTRIL) 10 MG tablet Take 1 tablet (10 mg total) by mouth daily.  30 tablet  12    Past Medical History  Diagnosis Date  . Cardiomyopathy     nonischemic ejection fraction 20% by cardiac cath  . Hypertension   . Acute systolic heart failure     exacerbation    Past Surgical History  Procedure Date  . Bilaterial pleural effusion   . Bladder surgery     trauma as a child during martial arts training    No family history on file.  History   Social History  . Marital Status: Married    Spouse Name: N/A    Number of Children: N/A  . Years of Education: N/A   Occupational History  . full time     security guard   Social History Main Topics  . Smoking status: Never Smoker   . Smokeless tobacco: Never Used  . Alcohol Use: No  . Drug Use: No  . Sexually Active: Not on file   Other Topics Concern  . Not on file   Social History Narrative  . No narrative on file    Review of Systems:  All systems reviewed.  They are negative to the above problem except as previously stated.  Vital Signs: BP 124/81  Pulse 71  Resp 18  Ht 5\' 10"  (1.778 m)  Wt 233 lb (105.688 kg)  BMI  33.43 kg/m2  Physical Exam Patient in NAD HEENT:  Normocephalic, atraumatic. EOMI, PERRLA.  Neck: JVP is normal. No thyromegaly. No bruits.  Lungs: clear to auscultation. No rales no wheezes.  Heart: Regular rate and rhythm. Normal S1, S2. No S3.   No significant murmurs. PMI not displaced.  Abdomen:  Supple, nontender. Normal bowel sounds. No masses. No hepatomegaly.  Extremities:   Good distal pulses throughout. No lower extremity edema.  Musculoskeletal :moving all extremities.  Neuro:   alert and oriented x3.  CN II-XII grossly intact.  EKG:  SR.  70   Assessment and Plan:  1.  HTN  Good control  I would keep on same regimen.  Told him to call sooner if he ever is about to run out nor needs assistence. Check BMET Encouraged wt loss has BP needs may improve.  2.  CM.  Resolved.  Volume status looks good.  Keep on same meds  3.  Dyslipidemia.  Check lipids today  Patient says he is eatting better.  Last lipids in April LDL was 140, HDL was 30  Stay active. F/U in 9 montsh.

## 2012-01-17 ENCOUNTER — Other Ambulatory Visit: Payer: Self-pay | Admitting: Cardiology

## 2012-04-13 ENCOUNTER — Encounter: Payer: Self-pay | Admitting: Adult Health

## 2012-04-13 ENCOUNTER — Ambulatory Visit (INDEPENDENT_AMBULATORY_CARE_PROVIDER_SITE_OTHER): Payer: BC Managed Care – PPO | Admitting: Adult Health

## 2012-04-13 VITALS — BP 118/88 | HR 74 | Resp 16 | Ht 69.0 in | Wt 231.1 lb

## 2012-04-13 DIAGNOSIS — E782 Mixed hyperlipidemia: Secondary | ICD-10-CM

## 2012-04-13 DIAGNOSIS — I428 Other cardiomyopathies: Secondary | ICD-10-CM

## 2012-04-13 DIAGNOSIS — I10 Essential (primary) hypertension: Secondary | ICD-10-CM

## 2012-04-13 DIAGNOSIS — E7800 Pure hypercholesterolemia, unspecified: Secondary | ICD-10-CM

## 2012-04-13 DIAGNOSIS — I429 Cardiomyopathy, unspecified: Secondary | ICD-10-CM

## 2012-04-13 MED ORDER — CARVEDILOL 12.5 MG PO TABS: 18.7500 mg | ORAL_TABLET | Freq: Two times a day (BID) | ORAL | Status: DC

## 2012-04-13 NOTE — Progress Notes (Deleted)
Name: Bryan Clark    DOB: 1970-08-08  Age: 42 y.o.  MR#: [id number]       PCP:  No primary provider on file.      Insurance: @PAYORNAME @   CC:   No chief complaint on file.   VS BP 118/88  Pulse 74  Resp 16  Ht 5\' 9"  (1.753 m)  Wt 231 lb 1.3 oz (104.817 kg)  BMI 34.12 kg/m2  SpO2 96%  Weights Current Weight  04/13/12 231 lb 1.3 oz (104.817 kg)  07/15/11 233 lb (105.688 kg)  11/14/10 227 lb (102.967 kg)    Blood Pressure  BP Readings from Last 3 Encounters:  04/13/12 118/88  07/15/11 124/81  11/14/10 130/85     Admit date:  (Not on file) Last encounter with RMR:  Visit date not found   Allergy No Known Allergies  Current Outpatient Prescriptions  Medication Sig Dispense Refill  . aspirin 81 MG tablet Take 81 mg by mouth daily.        Marland Kitchen COREG 12.5 MG tablet TAKE 1 AND 1/2 TABLETS BY MOUTH TWICE DAILY WITH MEALS.  90 tablet  3  . furosemide (LASIX) 40 MG tablet Take 1 tablet (40 mg total) by mouth daily.  30 tablet  12  . lisinopril (PRINIVIL,ZESTRIL) 10 MG tablet Take 1 tablet (10 mg total) by mouth daily.  30 tablet  12  . Multiple Vitamins-Minerals (MULTI-VITAMIN GUMMIES) CHEW Chew 2 tablets by mouth daily.        Discontinued Meds:   There are no discontinued medications.  Patient Active Problem List  Diagnosis  . Nonischemic cardiomyopathy  . Hypertension  . Hypercholesterolemia    LABS No visits with results within 3 Month(s) from this visit. Latest known visit with results is:  Office Visit on 07/15/2011  Component Date Value  . Cholesterol 07/15/2011 244*  . Triglycerides 07/15/2011 453*  . HDL 07/15/2011 35*  . Total CHOL/HDL Ratio 07/15/2011 7.0   . VLDL 07/15/2011 NOT CALC   . LDL Cholesterol 07/15/2011    . Sodium 07/15/2011 139   . Potassium 07/15/2011 4.0   . Chloride 07/15/2011 100   . CO2 07/15/2011 30   . Glucose, Bld 07/15/2011 187*  . BUN 07/15/2011 19   . Creat 07/15/2011 1.13   . Calcium 07/15/2011 9.5      Results for  this Opt Visit:     Results for orders placed in visit on 07/15/11  LIPID PANEL      Component Value Range   Cholesterol 244 (*) 0 - 200 mg/dL   Triglycerides 578 (*) <150 mg/dL   HDL 35 (*) >46 mg/dL   Total CHOL/HDL Ratio 7.0     VLDL NOT CALC  0 - 40 mg/dL   LDL Cholesterol      BASIC METABOLIC PANEL      Component Value Range   Sodium 139  135 - 145 mEq/L   Potassium 4.0  3.5 - 5.3 mEq/L   Chloride 100  96 - 112 mEq/L   CO2 30  19 - 32 mEq/L   Glucose, Bld 187 (*) 70 - 99 mg/dL   BUN 19  6 - 23 mg/dL   Creat 9.62  9.52 - 8.41 mg/dL   Calcium 9.5  8.4 - 32.4 mg/dL    EKG Orders placed in visit on 04/13/12  . EKG 12-LEAD     Prior Assessment and Plan Problem List as of 04/13/2012  Nonischemic cardiomyopathy   Last Assessment & Plan Note   11/14/2010 Office Visit Addendum 11/14/2010 12:10 PM by Dyann Kief, PA    Patient has history of nonischemic cardiomyopathy but 2-D echo on 11/11/2010 shows complete resolution of LV dysfunction with normal LV function. We will stop the patient's Aldactone because of low blood pressures. Patient may return to work full time.    Hypertension   Last Assessment & Plan Note   11/14/2010 Office Visit Signed 11/14/2010 12:09 PM by Dyann Kief, PA    His  blood pressure is on the low side. We will stop his Aldactone. His wife will continue to monitor his blood pressure checks and let us know if they go up.    Hypercholesterolemia   Last Assessment & Plan Note   08/08/2010 Office Visit Signed 08/08/2010  2:05 PM by Jodelle Gross, NP    Will plan lipids and LFT's with next blood drawn in 3 months.        Imaging: No results found.   FRS Calculation: Score not calculated. Missing: Total Cholesterol

## 2012-04-13 NOTE — Assessment & Plan Note (Signed)
Excellent control of BP today. He is given refills on all medications. Will check BMET for kidney fx on ACE.

## 2012-04-13 NOTE — Assessment & Plan Note (Signed)
Elevated on last visit without institution of statin. Will recheck status along with Hgb A1C as he had elevated TG on last check in 07/2011. If elevated, he will need to be seen by PCP for further testing and treatment.

## 2012-04-13 NOTE — Patient Instructions (Addendum)
Your physician recommends that you schedule a follow-up appointment in: ONE YEAR WITH KL  Your physician recommends that you return for lab work in: THIS WEEK (SLIPS GIVEN)

## 2012-04-13 NOTE — Assessment & Plan Note (Signed)
Last Echo in 2012 demonstrated resumption of normal EF. Continue medical management. We will see him in a year unless he is symptomatic.

## 2012-04-13 NOTE — Progress Notes (Signed)
   HPI: [name] is a 42 y/o patient of Dr. Tenny Craw we are seeing for ongoing assessment and treatment of NICM, with normal EF in 2012, hypertension, hypercholesterolemia. He comes today without complaint. Has become more active with marital arts classes 3 times a week. He is medically complaint, watching what he eats. He unfortunately has not been seen by PCP in a few years. No new allergies, or diagnosis. No urgent care or hospitalization since being seen last.   No Known Allergies  Current Outpatient Prescriptions  Medication Sig Dispense Refill  . aspirin 81 MG tablet Take 81 mg by mouth daily.        Marland Kitchen COREG 12.5 MG tablet TAKE 1 AND 1/2 TABLETS BY MOUTH TWICE DAILY WITH MEALS.  90 tablet  3  . furosemide (LASIX) 40 MG tablet Take 1 tablet (40 mg total) by mouth daily.  30 tablet  12  . lisinopril (PRINIVIL,ZESTRIL) 10 MG tablet Take 1 tablet (10 mg total) by mouth daily.  30 tablet  12  . Multiple Vitamins-Minerals (MULTI-VITAMIN GUMMIES) CHEW Chew 2 tablets by mouth daily.        Past Medical History  Diagnosis Date  . Cardiomyopathy     nonischemic ejection fraction 20% by cardiac cath  . Hypertension   . Acute systolic heart failure     exacerbation    Past Surgical History  Procedure Date  . Bilaterial pleural effusion   . Bladder surgery     trauma as a child during martial arts training    ROS: Review of systems complete and found to be negative unless listed above  PHYSICAL EXAM BP 118/88  Pulse 74  Resp 16  Ht 5\' 9"  (1.753 m)  Wt 231 lb 1.3 oz (104.817 kg)  BMI 34.12 kg/m2  SpO2 96%  General: Well developed, well nourished, in no acute distress Head: Eyes PERRLA, No xanthomas.   Normal cephalic and atramatic  Lungs: Clear bilaterally to auscultation and percussion. Heart: HRRR S1 S2, without MRG.  Pulses are 2+ & equal.            No carotid bruit. No JVD.  No abdominal bruits. No femoral bruits. Abdomen: Bowel sounds are positive, abdomen soft and  non-tender without masses or                  Hernia's noted. Msk:  Back normal, normal gait. Normal strength and tone for age. Extremities: No clubbing, cyanosis or edema.  DP +1 Neuro: Alert and oriented X 3. Psych:  Good affect, responds appropriately EKG: NSR rate of 74 bpm.  ASSESSMENT AND PLAN

## 2012-04-14 LAB — BASIC METABOLIC PANEL
BUN: 18 mg/dL (ref 6–23)
Calcium: 9.7 mg/dL (ref 8.4–10.5)
Creat: 1.07 mg/dL (ref 0.50–1.35)
Potassium: 4.2 mEq/L (ref 3.5–5.3)

## 2012-04-14 LAB — HEPATIC FUNCTION PANEL
ALT: 44 U/L (ref 0–53)
AST: 30 U/L (ref 0–37)
Indirect Bilirubin: 0.4 mg/dL (ref 0.0–0.9)
Total Protein: 6.6 g/dL (ref 6.0–8.3)

## 2012-04-14 LAB — LIPID PANEL
Cholesterol: 239 mg/dL — ABNORMAL HIGH (ref 0–200)
Triglycerides: 563 mg/dL — ABNORMAL HIGH (ref <150)

## 2012-04-14 LAB — HEMOGLOBIN A1C: Mean Plasma Glucose: 235 mg/dL — ABNORMAL HIGH (ref <117)

## 2012-04-15 MED ORDER — ATORVASTATIN CALCIUM 40 MG PO TABS: 40.0000 mg | ORAL_TABLET | Freq: Every day | ORAL | Status: DC

## 2012-04-15 MED ORDER — METFORMIN HCL 500 MG PO TABS: 500.0000 mg | ORAL_TABLET | Freq: Every day | ORAL | Status: DC

## 2012-04-15 NOTE — Addendum Note (Signed)
Addended by: Derry Lory A on: 04/15/2012 02:46 PM   Modules accepted: Orders

## 2012-08-17 ENCOUNTER — Other Ambulatory Visit: Payer: Self-pay | Admitting: *Deleted

## 2012-08-17 MED ORDER — ATORVASTATIN CALCIUM 40 MG PO TABS: 40.0000 mg | ORAL_TABLET | Freq: Every day | ORAL | Status: DC

## 2012-08-17 MED ORDER — FUROSEMIDE 40 MG PO TABS: 40.0000 mg | ORAL_TABLET | Freq: Every day | ORAL | Status: DC

## 2012-08-17 NOTE — Progress Notes (Signed)
Medication sent via escribe.  

## 2012-09-14 ENCOUNTER — Other Ambulatory Visit: Payer: Self-pay | Admitting: *Deleted

## 2012-09-14 MED ORDER — LISINOPRIL 10 MG PO TABS: 10.0000 mg | ORAL_TABLET | Freq: Every day | ORAL | Status: DC

## 2012-10-13 ENCOUNTER — Other Ambulatory Visit: Payer: Self-pay | Admitting: Cardiology

## 2012-11-08 ENCOUNTER — Other Ambulatory Visit: Payer: Self-pay

## 2012-11-08 MED ORDER — CARVEDILOL 12.5 MG PO TABS: 18.7500 mg | ORAL_TABLET | Freq: Two times a day (BID) | ORAL | Status: DC

## 2012-12-12 ENCOUNTER — Other Ambulatory Visit: Payer: Self-pay | Admitting: *Deleted

## 2012-12-12 MED ORDER — ATORVASTATIN CALCIUM 40 MG PO TABS: 40.0000 mg | ORAL_TABLET | Freq: Every day | ORAL | Status: DC

## 2013-03-12 ENCOUNTER — Other Ambulatory Visit: Payer: Self-pay | Admitting: Cardiology

## 2013-04-13 NOTE — Progress Notes (Signed)
    HPI: [name] is a 43 year old patient to be est. with Dr. Wyline Clark, we are following for ongoing assessment and management of nonischemic cardiomyopathy with a normal ejection fraction and 2012, hypertension, hypercholesterolemia. The patient was last seen in the office on 04/13/2012 and was found to be without complaint, blood pressure was well controlled, there was no evidence of heart failure. He was medically compliant.   He comes today without complaint. He has not yet found a PCP. Tried with Bryan Clark but never got appt with them. He has not had any ER, IP or surgeries since being seen one year ago.    He is without complaint of chest pain, DOE, dizziness for near syncope. He continues to do marital arts.   No Known Allergies  Current Outpatient Prescriptions  Medication Sig Dispense Refill  . aspirin 81 MG tablet Take 81 mg by mouth daily.        Marland Kitchen atorvastatin (LIPITOR) 40 MG tablet Take 1 tablet (40 mg total) by mouth daily.  30 tablet  6  . carvedilol (COREG) 12.5 MG tablet Take 1.5 tablets (18.75 mg total) by mouth 2 (two) times daily with a meal.  90 tablet  5  . furosemide (LASIX) 40 MG tablet TAKE ONE TABLET BY MOUTH ONCE DAILY.  30 tablet  2  . lisinopril (PRINIVIL,ZESTRIL) 10 MG tablet TAKE ONE TABLET BY MOUTH ONCE DAILY.  30 tablet  2  . metFORMIN (GLUCOPHAGE) 500 MG tablet Take 1 tablet (500 mg total) by mouth daily.  30 tablet  3  . Multiple Vitamins-Minerals (MULTI-VITAMIN GUMMIES) CHEW Chew 2 tablets by mouth daily.       No current facility-administered medications for this visit.    Past Medical History  Diagnosis Date  . Cardiomyopathy     nonischemic ejection fraction 20% by cardiac cath  . Hypertension   . Acute systolic heart failure     exacerbation    Past Surgical History  Procedure Laterality Date  . Bilaterial pleural effusion    . Bladder surgery      trauma as a child during martial arts training    VAN:VBTYOM of systems complete and found to  be negative unless listed above  PHYSICAL EXAM BP 121/85  Pulse 77  Ht 5\' 9"  (1.753 m)  Wt 225 lb (102.059 kg)  BMI 33.21 kg/m2  General: Well developed, well nourished, in no acute distress, obese. Head: Eyes PERRLA, No xanthomas.   Normal cephalic and atramatic  Lungs: Clear bilaterally to auscultation and percussion. Heart: HRRR S1 S2, without MRG.  Pulses are 2+ & equal.            No carotid bruit. No JVD.  No abdominal bruits. No femoral bruits. Abdomen: Bowel sounds are positive, abdomen soft and non-tender without masses or                  Hernia's noted. Msk:  Back normal, normal gait. Normal strength and tone for age. Extremities: No clubbing, cyanosis or edema.  DP +1 Neuro: Alert and oriented X 3. Psych:  Good affect, responds appropriately  EKG: NSR. Rate of 80 bpm.  ASSESSMENT AND PLAN

## 2013-04-14 ENCOUNTER — Ambulatory Visit (INDEPENDENT_AMBULATORY_CARE_PROVIDER_SITE_OTHER): Payer: BC Managed Care – PPO | Admitting: Adult Health

## 2013-04-14 ENCOUNTER — Encounter: Payer: Self-pay | Admitting: Adult Health

## 2013-04-14 VITALS — BP 121/85 | HR 77 | Ht 69.0 in | Wt 225.0 lb

## 2013-04-14 DIAGNOSIS — E119 Type 2 diabetes mellitus without complications: Secondary | ICD-10-CM

## 2013-04-14 DIAGNOSIS — I428 Other cardiomyopathies: Secondary | ICD-10-CM

## 2013-04-14 MED ORDER — LISINOPRIL 10 MG PO TABS: ORAL_TABLET | ORAL | Status: DC

## 2013-04-14 MED ORDER — METFORMIN HCL 500 MG PO TABS: 500.0000 mg | ORAL_TABLET | Freq: Every day | ORAL | Status: DC

## 2013-04-14 MED ORDER — FUROSEMIDE 40 MG PO TABS: ORAL_TABLET | ORAL | Status: DC

## 2013-04-14 MED ORDER — CARVEDILOL 12.5 MG PO TABS: 18.7500 mg | ORAL_TABLET | Freq: Two times a day (BID) | ORAL | Status: DC

## 2013-04-14 MED ORDER — ATORVASTATIN CALCIUM 40 MG PO TABS: 40.0000 mg | ORAL_TABLET | Freq: Every day | ORAL | Status: DC

## 2013-04-14 NOTE — Assessment & Plan Note (Signed)
Fasting lipids and LFT's will be drawn in am.

## 2013-04-14 NOTE — Addendum Note (Signed)
Addended by: Thompson Grayer on: 04/14/2013 03:33 PM   Modules accepted: Orders

## 2013-04-14 NOTE — Assessment & Plan Note (Signed)
He is doing well symptomatically. I will repeat echo for ongoing assessment. He will continue ACE, BB and lasix. Will have BMET drawn for evaluation of his status.

## 2013-04-14 NOTE — Assessment & Plan Note (Signed)
He does not have a PCP. I have given him Rx for metformin but is to be established with Dr Karilyn Cota for ongoing management. He will have Hgb A1C drawn. All labs will be sent to Dr. Karilyn Cota.

## 2013-04-14 NOTE — Assessment & Plan Note (Signed)
He is well controlled currently. Will continue current medications.

## 2013-04-14 NOTE — Patient Instructions (Addendum)
Your physician recommends that you schedule a follow-up appointment in: 6 months with Dr Lurena Joiner will receive a reminder letter two months in advance reminding you to call and schedule your appointment. If you don't receive this letter, please contact our office.  Your physician has requested that you have an echocardiogram. Echocardiography is a painless test that uses sound waves to create images of your heart. It provides your doctor with information about the size and shape of your heart and how well your heart's chambers and valves are working. This procedure takes approximately one hour. There are no restrictions for this procedure.  Your physician recommends that you return for lab work in the morning. Lipids, LFTs, BMET, CBC, Hem A1C  Please call and make an appointment:  Lilly Cove, MD Internal Medicine Location and Office Hours [facility] [address] [place], [place] [phone]

## 2013-04-14 NOTE — Progress Notes (Signed)
Name: Bryan Clark    DOB: 11-10-1970  Age: 43 y.o.  MR#: [id number]       PCP:  No primary provider on file.      Insurance: Payor: [employer], [employer] / Plan: BCBS [employer] PPO / Product Type: *No Product type* /   CC:    Chief Complaint  Patient presents with  . Cardiomyopathy  . Hypertension    VS Filed Vitals:   04/14/13 1306  BP: 121/85  Pulse: 77  Height: _0  (1.753 m)  Weight: 225 lb (102.059 kg)    Weights Current Weight  04/14/13 225 lb (102.059 kg)  04/13/12 231 lb 1.3 oz (104.817 kg)  07/15/11 233 lb (105.688 kg)    Blood Pressure  BP Readings from Last 3 Encounters:  04/14/13 121/85  04/13/12 118/88  07/15/11 124/81     Admit date:  (Not on file) Last encounter with RMR:  Visit date not found   Allergy Review of patient's allergies indicates no known allergies.  Current Outpatient Prescriptions  Medication Sig Dispense Refill  . aspirin 81 MG tablet Take 81 mg by mouth daily.        Marland Kitchen atorvastatin (LIPITOR) 40 MG tablet Take 1 tablet (40 mg total) by mouth daily.  30 tablet  6  . carvedilol (COREG) 12.5 MG tablet Take 1.5 tablets (18.75 mg total) by mouth 2 (two) times daily with a meal.  90 tablet  5  . furosemide (LASIX) 40 MG tablet TAKE ONE TABLET BY MOUTH ONCE DAILY.  30 tablet  2  . lisinopril (PRINIVIL,ZESTRIL) 10 MG tablet TAKE ONE TABLET BY MOUTH ONCE DAILY.  30 tablet  2  . metFORMIN (GLUCOPHAGE) 500 MG tablet Take 1 tablet (500 mg total) by mouth daily.  30 tablet  3  . Multiple Vitamins-Minerals (MULTI-VITAMIN GUMMIES) CHEW Chew 2 tablets by mouth daily.       No current facility-administered medications for this visit.    Discontinued Meds:   There are no discontinued medications.  Patient Active Problem List   Diagnosis Date Noted  . Nonischemic cardiomyopathy 07/11/2010  . Hypertension 07/11/2010  . Hypercholesterolemia 07/11/2010    LABS    Component Value Date/Time   NA 136 04/14/2012 0850   NA 139 07/15/2011 1039   NA 137  11/08/2010 1020   K 4.2 04/14/2012 0850   K 4.0 07/15/2011 1039   K 4.4 11/08/2010 1020   CL 101 04/14/2012 0850   CL 100 07/15/2011 1039   CL 100 11/08/2010 1020   CO2 28 04/14/2012 0850   CO2 30 07/15/2011 1039   CO2 25 11/08/2010 1020   GLUCOSE 212* 04/14/2012 0850   GLUCOSE 187* 07/15/2011 1039   GLUCOSE 124* 11/08/2010 1020   BUN 18 04/14/2012 0850   BUN 19 07/15/2011 1039   BUN 22 11/08/2010 1020   CREATININE 1.07 04/14/2012 0850   CREATININE 1.13 07/15/2011 1039   CREATININE 1.22 11/08/2010 1020   CREATININE 1.45 07/04/2010 0455   CREATININE 1.38 07/03/2010 0951   CREATININE 1.11 07/02/2010 0517   CALCIUM 9.7 04/14/2012 0850   CALCIUM 9.5 07/15/2011 1039   CALCIUM 9.2 11/08/2010 1020   GFRNONAA 54* 07/04/2010 0455   GFRNONAA 57* 07/03/2010 0951   GFRNONAA >60 07/02/2010 0517   GFRAA  Value: >60        The eGFR has been calculated using the MDRD equation. This calculation has not been validated in all clinical situations. eGFR's persistently <60 mL/min signify possible Chronic  Kidney Disease. 07/04/2010 0455   GFRAA  Value: >60        The eGFR has been calculated using the MDRD equation. This calculation has not been validated in all clinical situations. eGFR's persistently <60 mL/min signify possible Chronic Kidney Disease. 07/03/2010 0951   GFRAA  Value: >60        The eGFR has been calculated using the MDRD equation. This calculation has not been validated in all clinical situations. eGFR's persistently <60 mL/min signify possible Chronic Kidney Disease. 07/02/2010 0517   CMP     Component Value Date/Time   NA 136 04/14/2012 0850   K 4.2 04/14/2012 0850   CL 101 04/14/2012 0850   CO2 28 04/14/2012 0850   GLUCOSE 212* 04/14/2012 0850   BUN 18 04/14/2012 0850   CREATININE 1.07 04/14/2012 0850   CREATININE 1.45 07/04/2010 0455   CALCIUM 9.7 04/14/2012 0850   PROT 6.6 04/14/2012 0850   ALBUMIN 4.1 04/14/2012 0850   AST 30 04/14/2012 0850   ALT 44 04/14/2012 0850   ALKPHOS 55 04/14/2012 0850   BILITOT 0.5 04/14/2012 0850   GFRNONAA 54*  07/04/2010 0455   GFRAA  Value: >60        The eGFR has been calculated using the MDRD equation. This calculation has not been validated in all clinical situations. eGFR's persistently <60 mL/min signify possible Chronic Kidney Disease. 07/04/2010 0455       Component Value Date/Time   WBC 10.8* 07/01/2010 0340   WBC 8.8 06/30/2010 1334   HGB 14.8 07/01/2010 0340   HGB 15.7 06/30/2010 1334   HCT 43.1 07/01/2010 0340   HCT 46.4 06/30/2010 1334   MCV 89.0 07/01/2010 0340   MCV 91.9 06/30/2010 1334    Lipid Panel     Component Value Date/Time   CHOL 239* 04/14/2012 0850   TRIG 563* 04/14/2012 0850   HDL 34* 04/14/2012 0850   CHOLHDL 7.0 04/14/2012 0850   VLDL NOT CALC 04/14/2012 0850   LDLCALC Comment:   Not calculated due to Triglyceride >400. Suggest ordering Direct LDL (Unit Code: [phone]).   Total Cholesterol/HDL Ratio:CHD Risk                        Coronary Heart Disease Risk Table                                        Men       Women          1/2 Average Risk              3.4        3.3              Average Risk              5.0        4.4           2X Average Risk              9.6        7.1           3X Average Risk             23.4       11.0 Use the calculated Patient Ratio above and the CHD Risk table  to determine the patient's CHD Risk. ATP III Classification (  LDL):       < 100        mg/dL         Optimal      100 - 129     mg/dL         Near or Above Optimal      130 - 159     mg/dL         Borderline High      160 - 189     mg/dL         High       > 190        mg/dL         Very High   04/14/2012 0850    ABG    Component Value Date/Time   PHART 7.405 07/03/2010 1025   PCO2ART 41.0 07/03/2010 1025   PO2ART 68.0* 07/03/2010 1025   HCO3 24.0 07/03/2010 1027   TCO2 25 07/03/2010 1027   ACIDBASEDEF 2.0 07/03/2010 1027   O2SAT 57.0 07/03/2010 1027     Lab Results  Component Value Date   TSH 1.046 07/02/2010   BNP (last 3 results) No results found for this basename: PROBNP,  in the last 8760  hours Cardiac Panel (last 3 results) No results found for this basename: CKTOTAL, CKMB, TROPONINI, RELINDX,  in the last 72 hours  Iron/TIBC/Ferritin    Component Value Date/Time   FERRITIN 116 07/01/2010 1543     EKG Orders placed in visit on 04/13/12  . EKG 12-LEAD     Prior Assessment and Plan Problem List as of 04/14/2013     Cardiovascular and Mediastinum   Nonischemic cardiomyopathy   Last Assessment & Plan   04/13/2012 Office Visit Written 04/13/2012  1:39 PM by Lendon Colonel, NP     Last Echo in 2012 demonstrated resumption of normal EF. Continue medical management. We will see him in a year unless he is symptomatic.    Hypertension   Last Assessment & Plan   04/13/2012 Office Visit Written 04/13/2012  1:38 PM by Lendon Colonel, NP     Excellent control of BP today. He is given refills on all medications. Will check BMET for kidney fx on ACE.       Other   Hypercholesterolemia   Last Assessment & Plan   04/13/2012 Office Visit Written 04/13/2012  1:38 PM by Lendon Colonel, NP     Elevated on last visit without institution of statin. Will recheck status along with Hgb A1C as he had elevated TG on last check in 07/2011. If elevated, he will need to be seen by PCP for further testing and treatment.         Imaging: No results found.

## 2013-04-15 LAB — CBC
HCT: 47.5 % (ref 39.0–52.0)
Hemoglobin: 16.3 g/dL (ref 13.0–17.0)
MCH: 31 pg (ref 26.0–34.0)
MCHC: 34.3 g/dL (ref 30.0–36.0)
MCV: 90.3 fL (ref 78.0–100.0)
PLATELETS: 144 10*3/uL — AB (ref 150–400)
RBC: 5.26 MIL/uL (ref 4.22–5.81)
RDW: 13.5 % (ref 11.5–15.5)
WBC: 6.5 10*3/uL (ref 4.0–10.5)

## 2013-04-15 LAB — HEMOGLOBIN A1C
HEMOGLOBIN A1C: 12.5 % — AB (ref <5.7)
Mean Plasma Glucose: 312 mg/dL — ABNORMAL HIGH (ref <117)

## 2013-04-15 LAB — HEPATIC FUNCTION PANEL
ALK PHOS: 84 U/L (ref 39–117)
ALT: 42 U/L (ref 0–53)
AST: 31 U/L (ref 0–37)
Albumin: 4 g/dL (ref 3.5–5.2)
BILIRUBIN DIRECT: 0.1 mg/dL (ref 0.0–0.3)
BILIRUBIN INDIRECT: 0.3 mg/dL (ref 0.2–1.2)
Total Bilirubin: 0.4 mg/dL (ref 0.2–1.2)
Total Protein: 6.8 g/dL (ref 6.0–8.3)

## 2013-04-15 LAB — LIPID PANEL
CHOLESTEROL: 243 mg/dL — AB (ref 0–200)
HDL: 34 mg/dL — ABNORMAL LOW (ref >39)
TRIGLYCERIDES: 1277 mg/dL — AB (ref <150)
Total CHOL/HDL Ratio: 7.1 Ratio

## 2013-04-15 LAB — BASIC METABOLIC PANEL
BUN: 15 mg/dL (ref 6–23)
CALCIUM: 9.8 mg/dL (ref 8.4–10.5)
CO2: 27 mEq/L (ref 19–32)
CREATININE: 1.04 mg/dL (ref 0.50–1.35)
Chloride: 95 mEq/L — ABNORMAL LOW (ref 96–112)
GLUCOSE: 364 mg/dL — AB (ref 70–99)
Potassium: 4.5 mEq/L (ref 3.5–5.3)
Sodium: 133 mEq/L — ABNORMAL LOW (ref 135–145)

## 2013-04-19 ENCOUNTER — Ambulatory Visit (HOSPITAL_COMMUNITY)
Admission: RE | Admit: 2013-04-19 | Discharge: 2013-04-19 | Disposition: A | Discharge status: Home / Self Care | Payer: BC Managed Care – PPO | Source: Ambulatory Visit | Attending: Adult Health | Admitting: Adult Health

## 2013-04-19 DIAGNOSIS — I509 Heart failure, unspecified: Secondary | ICD-10-CM | POA: Insufficient documentation

## 2013-04-19 DIAGNOSIS — I428 Other cardiomyopathies: Secondary | ICD-10-CM | POA: Insufficient documentation

## 2013-04-19 DIAGNOSIS — E785 Hyperlipidemia, unspecified: Secondary | ICD-10-CM | POA: Insufficient documentation

## 2013-04-19 DIAGNOSIS — I519 Heart disease, unspecified: Secondary | ICD-10-CM

## 2013-04-19 DIAGNOSIS — Z6833 Body mass index (BMI) 33.0-33.9, adult: Secondary | ICD-10-CM | POA: Insufficient documentation

## 2013-04-19 DIAGNOSIS — I10 Essential (primary) hypertension: Secondary | ICD-10-CM | POA: Insufficient documentation

## 2013-04-19 DIAGNOSIS — E119 Type 2 diabetes mellitus without complications: Secondary | ICD-10-CM | POA: Insufficient documentation

## 2013-04-19 NOTE — Progress Notes (Signed)
*  PRELIMINARY RESULTS* Echocardiogram 2 D echocardiogram has been performed.  Don Giarrusso 04/19/2013, 11:07 AM

## 2013-04-19 NOTE — Addendum Note (Signed)
Addended by: Thompson Grayer on: 04/19/2013 04:47 PM   Modules accepted: Orders

## 2013-06-13 ENCOUNTER — Other Ambulatory Visit: Payer: Self-pay | Admitting: Cardiology

## 2013-06-13 ENCOUNTER — Telehealth: Payer: Self-pay

## 2013-06-13 NOTE — Telephone Encounter (Signed)
[facility] requesting refills for Metformin.Per K.lawrence NP we would prescribe initial script for Metformin.Her note indicates he was going to see Dr.Gosrani.Patient states he called his office and "they weren't in" . I encouraged him to call back and establish care.He said he does have medical insurance.I also gave him Elizbeth Squires number to investigate   I also informed pharmacist at [facility]

## 2013-07-14 ENCOUNTER — Telehealth: Payer: Self-pay | Admitting: *Deleted

## 2013-07-14 MED ORDER — CARVEDILOL 12.5 MG PO TABS: 18.7500 mg | ORAL_TABLET | Freq: Two times a day (BID) | ORAL | Status: DC

## 2013-07-14 NOTE — Telephone Encounter (Signed)
Medication sent via escribe.  

## 2013-07-14 NOTE — Telephone Encounter (Signed)
Carvedilol 12.5 mh #90

## 2013-10-12 ENCOUNTER — Telehealth: Payer: Self-pay | Admitting: Adult Health

## 2013-10-12 MED ORDER — CARVEDILOL 12.5 MG PO TABS: 18.7500 mg | ORAL_TABLET | Freq: Two times a day (BID) | ORAL | Status: DC

## 2013-10-12 NOTE — Telephone Encounter (Signed)
Received fax refill request  Rx # [id number] Medication:  Carvedilol 12.5 mg tablets Qty 90 Sig:  Take 1 and 1/2 tablets by mouth twice daily with meals Physician:  Lyman Bishop

## 2013-10-12 NOTE — Telephone Encounter (Signed)
Medication sent via escribe.  

## 2013-11-11 ENCOUNTER — Other Ambulatory Visit: Payer: Self-pay | Admitting: Adult Health

## 2013-11-14 NOTE — Telephone Encounter (Signed)
Medication sent to pharmacy  

## 2013-12-04 ENCOUNTER — Encounter (HOSPITAL_COMMUNITY): Payer: Self-pay | Admitting: Emergency Medicine

## 2013-12-04 ENCOUNTER — Emergency Department (HOSPITAL_COMMUNITY)
Admission: EM | Admit: 2013-12-04 | Discharge: 2013-12-04 | Disposition: A | Discharge status: Home / Self Care | Payer: BC Managed Care – PPO | Attending: Emergency Medicine | Admitting: Emergency Medicine

## 2013-12-04 ENCOUNTER — Emergency Department (HOSPITAL_COMMUNITY): Payer: BC Managed Care – PPO

## 2013-12-04 DIAGNOSIS — M549 Dorsalgia, unspecified: Secondary | ICD-10-CM | POA: Insufficient documentation

## 2013-12-04 DIAGNOSIS — Z9889 Other specified postprocedural states: Secondary | ICD-10-CM | POA: Insufficient documentation

## 2013-12-04 DIAGNOSIS — R739 Hyperglycemia, unspecified: Secondary | ICD-10-CM

## 2013-12-04 DIAGNOSIS — I5021 Acute systolic (congestive) heart failure: Secondary | ICD-10-CM | POA: Insufficient documentation

## 2013-12-04 DIAGNOSIS — R7309 Other abnormal glucose: Secondary | ICD-10-CM | POA: Diagnosis not present

## 2013-12-04 DIAGNOSIS — Z7982 Long term (current) use of aspirin: Secondary | ICD-10-CM | POA: Diagnosis not present

## 2013-12-04 DIAGNOSIS — N201 Calculus of ureter: Secondary | ICD-10-CM | POA: Diagnosis not present

## 2013-12-04 DIAGNOSIS — I10 Essential (primary) hypertension: Secondary | ICD-10-CM | POA: Diagnosis not present

## 2013-12-04 DIAGNOSIS — Z79899 Other long term (current) drug therapy: Secondary | ICD-10-CM | POA: Diagnosis not present

## 2013-12-04 LAB — CBC WITH DIFFERENTIAL/PLATELET
Basophils Absolute: 0 10*3/uL (ref 0.0–0.1)
Basophils Relative: 0 % (ref 0–1)
EOS PCT: 3 % (ref 0–5)
Eosinophils Absolute: 0.2 10*3/uL (ref 0.0–0.7)
HEMATOCRIT: 47.9 % (ref 39.0–52.0)
Hemoglobin: 16.6 g/dL (ref 13.0–17.0)
Lymphocytes Relative: 29 % (ref 12–46)
Lymphs Abs: 2 10*3/uL (ref 0.7–4.0)
MCH: 31.4 pg (ref 26.0–34.0)
MCHC: 34.7 g/dL (ref 30.0–36.0)
MCV: 90.5 fL (ref 78.0–100.0)
MONO ABS: 0.5 10*3/uL (ref 0.1–1.0)
Monocytes Relative: 7 % (ref 3–12)
Neutro Abs: 4.4 10*3/uL (ref 1.7–7.7)
Neutrophils Relative %: 61 % (ref 43–77)
Platelets: 132 10*3/uL — ABNORMAL LOW (ref 150–400)
RBC: 5.29 MIL/uL (ref 4.22–5.81)
RDW: 12.8 % (ref 11.5–15.5)
WBC: 7.1 10*3/uL (ref 4.0–10.5)

## 2013-12-04 LAB — BASIC METABOLIC PANEL
ANION GAP: 14 (ref 5–15)
BUN: 15 mg/dL (ref 6–23)
CALCIUM: 9.6 mg/dL (ref 8.4–10.5)
CO2: 27 meq/L (ref 19–32)
CREATININE: 1.06 mg/dL (ref 0.50–1.35)
Chloride: 95 mEq/L — ABNORMAL LOW (ref 96–112)
GFR calc Af Amer: >90 mL/min (ref >90)
GFR calc non Af Amer: 85 mL/min — ABNORMAL LOW (ref >90)
Glucose, Bld: 450 mg/dL — ABNORMAL HIGH (ref 70–99)
Potassium: 4.5 mEq/L (ref 3.7–5.3)
Sodium: 136 mEq/L — ABNORMAL LOW (ref 137–147)

## 2013-12-04 LAB — URINALYSIS, ROUTINE W REFLEX MICROSCOPIC
BILIRUBIN URINE: NEGATIVE
KETONES UR: NEGATIVE mg/dL
Leukocytes, UA: NEGATIVE
Nitrite: NEGATIVE
PROTEIN: NEGATIVE mg/dL
Specific Gravity, Urine: <1.005 — ABNORMAL LOW (ref 1.005–1.030)
Urobilinogen, UA: 0.2 mg/dL (ref 0.0–1.0)
pH: 5.5 (ref 5.0–8.0)

## 2013-12-04 LAB — URINE MICROSCOPIC-ADD ON

## 2013-12-04 LAB — CBG MONITORING, ED: Glucose-Capillary: 319 mg/dL — ABNORMAL HIGH (ref 70–99)

## 2013-12-04 MED ORDER — SODIUM CHLORIDE 0.9 % IV BOLUS (SEPSIS)
1000.0000 mL | Freq: Once | INTRAVENOUS | Status: AC
  Administered 2013-12-04: 1000 mL via INTRAVENOUS

## 2013-12-04 MED ORDER — ONDANSETRON HCL 4 MG PO TABS: 4.0000 mg | ORAL_TABLET | Freq: Four times a day (QID) | ORAL | Status: DC

## 2013-12-04 MED ORDER — ONDANSETRON HCL 4 MG/2ML IJ SOLN
4.0000 mg | Freq: Once | INTRAMUSCULAR | Status: AC
  Administered 2013-12-04: 4 mg via INTRAVENOUS
  Filled 2013-12-04: qty 2

## 2013-12-04 MED ORDER — OXYCODONE-ACETAMINOPHEN 5-325 MG PO TABS: 2.0000 | ORAL_TABLET | ORAL | Status: DC | PRN

## 2013-12-04 MED ORDER — IBUPROFEN 800 MG PO TABS: 800.0000 mg | ORAL_TABLET | Freq: Three times a day (TID) | ORAL | Status: DC

## 2013-12-04 MED ORDER — OXYCODONE-ACETAMINOPHEN 5-325 MG PO TABS
2.0000 | ORAL_TABLET | Freq: Once | ORAL | Status: AC
  Administered 2013-12-04: 2 via ORAL
  Filled 2013-12-04: qty 2

## 2013-12-04 MED ORDER — TAMSULOSIN HCL 0.4 MG PO CAPS: 0.4000 mg | ORAL_CAPSULE | Freq: Every day | ORAL | Status: DC

## 2013-12-04 MED ORDER — MORPHINE SULFATE 4 MG/ML IJ SOLN
4.0000 mg | Freq: Once | INTRAMUSCULAR | Status: AC
  Administered 2013-12-04: 4 mg via INTRAVENOUS
  Filled 2013-12-04: qty 1

## 2013-12-04 NOTE — ED Provider Notes (Signed)
CSN: [id number]     Arrival date & time 12/04/13  1830 History  This chart was scribed for Bryan Octave, MD by Tonye Royalty, ED Scribe. This patient was seen in room APAH4/APAH4 and the patient's care was started at 7:40 PM.    Chief Complaint  Patient presents with  . Back Pain   The history is provided by the patient. No language interpreter was used.   HPI Comments: Bryan Clark is a 43 y.o. male who presents to the Emergency Department complaining of stabbing pain to his left back and flank area around to his left abdomen with onset at 3:00 today that woke him from a nap. He also reports a knot in his left abdomen. He states pain is constant and seems to be better with sitting and breathing; he states nothing makes the pain worse. He reports associated dysuria, testicular pain, and penile pain. He states he has not taken any medication for it at home. He reports history of hypertension and CHF. He denies fever, vomiting, hematuria, or chest pain  Past Medical History  Diagnosis Date  . Cardiomyopathy     nonischemic ejection fraction 20% by cardiac cath  . Hypertension   . Acute systolic heart failure     exacerbation   Past Surgical History  Procedure Laterality Date  . Bilaterial pleural effusion    . Bladder surgery      trauma as a child during martial arts training   History reviewed. No pertinent family history. History  Substance Use Topics  . Smoking status: Never Smoker   . Smokeless tobacco: Never Used  . Alcohol Use: No    Review of Systems  Constitutional: Negative for fever.  Cardiovascular: Negative for chest pain.  Gastrointestinal: Positive for abdominal pain. Negative for vomiting.  Genitourinary: Positive for dysuria, flank pain, penile pain and testicular pain. Negative for hematuria.  Musculoskeletal: Positive for back pain.  A complete 10 system review of systems was obtained and all systems are negative except as noted in the HPI and PMH.    Allergies  Bee venom  Home Medications   Prior to Admission medications   Medication Sig Start Date End Date Taking? Authorizing Provider  aspirin EC 81 MG tablet Take 81 mg by mouth daily.   Yes Historical Provider, MD  atorvastatin (LIPITOR) 40 MG tablet Take 40 mg by mouth daily.   Yes Historical Provider, MD  carvedilol (COREG) 12.5 MG tablet Take 1.5 tablets (18.75 mg total) by mouth 2 (two) times daily with a meal. 10/12/13  Yes Jodelle Gross, NP  furosemide (LASIX) 40 MG tablet Take 40 mg by mouth daily.   Yes Historical Provider, MD  lisinopril (PRINIVIL,ZESTRIL) 10 MG tablet Take 10 mg by mouth daily.   Yes Historical Provider, MD  metFORMIN (GLUCOPHAGE) 500 MG tablet Take 500 mg by mouth daily.  04/14/13  Yes Jodelle Gross, NP  Multiple Vitamin (MULTIVITAMIN WITH MINERALS) TABS tablet Take 1 tablet by mouth daily.   Yes Historical Provider, MD  ibuprofen (ADVIL,MOTRIN) 800 MG tablet Take 1 tablet (800 mg total) by mouth 3 (three) times daily. 12/04/13   Bryan Octave, MD  ondansetron (ZOFRAN) 4 MG tablet Take 1 tablet (4 mg total) by mouth every 6 (six) hours. 12/04/13   Bryan Octave, MD  oxyCODONE-acetaminophen (PERCOCET/ROXICET) 5-325 MG per tablet Take 2 tablets by mouth every 4 (four) hours as needed for severe pain. 12/04/13   Bryan Octave, MD  tamsulosin (FLOMAX) 0.4 MG CAPS  capsule Take 1 capsule (0.4 mg total) by mouth daily. 12/04/13   Bryan Octave, MD   BP 125/82  Pulse 95  Temp(Src) 98.8 F (37.1 C) (Oral)  Resp 20  Ht 5\' 9"  (1.753 m)  Wt 218 lb (98.884 kg)  BMI 32.18 kg/m2  SpO2 98% Physical Exam  Nursing note and vitals reviewed. Constitutional: He is oriented to person, place, and time. He appears well-developed and well-nourished. No distress.  HENT:  Head: Normocephalic and atraumatic.  Mouth/Throat: Oropharynx is clear and moist. No oropharyngeal exudate.  Eyes: Conjunctivae and EOM are normal. Pupils are equal, round, and reactive to  light.  Neck: Normal range of motion. Neck supple.  No meningismus.  Cardiovascular: Normal rate, regular rhythm, normal heart sounds and intact distal pulses.   No murmur heard. Pulmonary/Chest: Effort normal and breath sounds normal. No respiratory distress.  Abdominal: Soft. There is no tenderness. There is no rebound and no guarding.  Genitourinary:  Testicles nontender  Musculoskeletal: Normal range of motion. He exhibits no edema and no tenderness.  left paraspinal lumbar tenderness, no midline tenderness, 5/5 strength in bilateral lower extremities. Ankle plantar and dorsiflexion intact. Great toe extension intact bilaterally. +2 DP and PT pulses. +2 patellar reflexes bilaterally.  Neurological: He is alert and oriented to person, place, and time. No cranial nerve deficit. He exhibits normal muscle tone. Coordination normal.  No ataxia on finger to nose bilaterally. No pronator drift. 5/5 strength throughout. CN 2-12 intact. Negative Romberg. Equal grip strength. Sensation intact. Gait is normal.   Skin: Skin is warm.  Psychiatric: He has a normal mood and affect. His behavior is normal.    ED Course  Procedures (including critical care time) Labs Review Labs Reviewed  URINALYSIS, ROUTINE W REFLEX MICROSCOPIC - Abnormal; Notable for the following:    Specific Gravity, Urine <1.005 (*)    Glucose, UA >1000 (*)    Hgb urine dipstick LARGE (*)    All other components within normal limits  CBC WITH DIFFERENTIAL - Abnormal; Notable for the following:    Platelets 132 (*)    All other components within normal limits  BASIC METABOLIC PANEL - Abnormal; Notable for the following:    Sodium 136 (*)    Chloride 95 (*)    Glucose, Bld 450 (*)    GFR calc non Af Amer 85 (*)    All other components within normal limits  CBG MONITORING, ED - Abnormal; Notable for the following:    Glucose-Capillary 319 (*)    All other components within normal limits  URINE MICROSCOPIC-ADD ON     Imaging Review Ct Abdomen Pelvis Wo Contrast  12/04/2013   CLINICAL DATA:  Left-sided back and flank pain and possible knot over the left abdomen. No history of stones.  EXAM: CT ABDOMEN AND PELVIS WITHOUT CONTRAST  TECHNIQUE: Multidetector CT imaging of the abdomen and pelvis was performed following the standard protocol without IV contrast.  COMPARISON:  Chest CT 06/30/2010  FINDINGS: Lung bases are within normal.  Abdominal images demonstrate mild diffuse hepatic steatosis. The spleen, pancreas, gallbladder and adrenal glands are within normal. The appendix is normal. There is mild diverticulosis of the colon. Small bowel is within normal.  Kidneys are normal in size, shape and position without evidence of nephrolithiasis. There is mild dilatation of the left intrarenal collecting system and ureter as there is a 3-4 mm stone over the left UVJ. The right ureter is normal.  Pelvic images demonstrate several pelvic phleboliths. The  prostate and rectum are within normal. There is minimal degenerative change of the spine and hips.  IMPRESSION: 3-4 mm stone over the left UVJ causing low-grade obstruction.  Mild diffuse hepatic steatosis.  Mild diverticulosis of the colon.   Electronically Signed   By: Elberta Fortis M.D.   On: 12/04/2013 20:40     EKG Interpretation None     DIAGNOSTIC STUDIES: Oxygen Saturation is 98% on room air, normal by my interpretation.    COORDINATION OF CARE: 7:45 PM Discussed treatment plan with patient at beside, the patient agrees with the plan and has no further questions at this time.  8:48 PM Discussed with the patient his imaging results that show evidence of a 3mm kidney stone. I also discussed his lab results that show elevated blood sugar.  9:23 PM Patient feels improved.   MDM   Final diagnoses:  Ureterolithiasis  Hyperglycemia   Constant L flank pain that onset last night.  Associated with dysuria and nausea.  No CP, SOB.   Hematuria without  evidence of infection. Hyperglycemia without evidence of DKA. Patient given IV fluids  Sugar improved to 319.. CT with distal L UVJ stone.  Cr normal.  UA without infection.  Pain controlled in the ED. Tolerating PO.  Refer to urology. Follow up with PCP for hyperglycemia and continue metformin. Return precautions discussed.  BP 125/82  Pulse 95  Temp(Src) 98.8 F (37.1 C) (Oral)  Resp 20  Ht  (1.753 m)  Wt 218 lb (98.884 kg)  BMI 32.18 kg/m2  SpO2 98%   I personally performed the services described in this documentation, which was scribed in my presence. The recorded information has been reviewed and is accurate.   Bryan Octave, MD 12/05/13 [phone]

## 2013-12-04 NOTE — ED Notes (Signed)
Pt c/o pain to left side that woke him from sleep; pt states the pain is in left flank and feels like a stabbing pain

## 2013-12-04 NOTE — Discharge Instructions (Signed)
Kidney Stones Follow up with the urologist. Follow up with your doctor regarding your elevated blood sugars. Return to the ED if you develop new or worsening symptoms. Kidney stones (urolithiasis) are deposits that form inside your kidneys. The intense pain is caused by the stone moving through the urinary tract. When the stone moves, the ureter goes into spasm around the stone. The stone is usually passed in the urine.  CAUSES   A disorder that makes certain neck glands produce too much parathyroid hormone (primary hyperparathyroidism).  A buildup of uric acid crystals, similar to gout in your joints.  Narrowing (stricture) of the ureter.  A kidney obstruction present at birth (congenital obstruction).  Previous surgery on the kidney or ureters.  Numerous kidney infections. SYMPTOMS   Feeling sick to your stomach (nauseous).  Throwing up (vomiting).  Blood in the urine (hematuria).  Pain that usually spreads (radiates) to the groin.  Frequency or urgency of urination. DIAGNOSIS   Taking a history and physical exam.  Blood or urine tests.  CT scan.  Occasionally, an examination of the inside of the urinary bladder (cystoscopy) is performed. TREATMENT   Observation.  Increasing your fluid intake.  Extracorporeal shock wave lithotripsy--This is a noninvasive procedure that uses shock waves to break up kidney stones.  Surgery may be needed if you have severe pain or persistent obstruction. There are various surgical procedures. Most of the procedures are performed with the use of small instruments. Only small incisions are needed to accommodate these instruments, so recovery time is minimized. The size, location, and chemical composition are all important variables that will determine the proper choice of action for you. Talk to your health care provider to better understand your situation so that you will minimize the risk of injury to yourself and your kidney.  HOME CARE  INSTRUCTIONS   Drink enough water and fluids to keep your urine clear or pale yellow. This will help you to pass the stone or stone fragments.  Strain all urine through the provided strainer. Keep all particulate matter and stones for your health care provider to see. The stone causing the pain may be as small as a grain of salt. It is very important to use the strainer each and every time you pass your urine. The collection of your stone will allow your health care provider to analyze it and verify that a stone has actually passed. The stone analysis will often identify what you can do to reduce the incidence of recurrences.  Only take over-the-counter or prescription medicines for pain, discomfort, or fever as directed by your health care provider.  Make a follow-up appointment with your health care provider as directed.  Get follow-up X-rays if required. The absence of pain does not always mean that the stone has passed. It may have only stopped moving. If the urine remains completely obstructed, it can cause loss of kidney function or even complete destruction of the kidney. It is your responsibility to make sure X-rays and follow-ups are completed. Ultrasounds of the kidney can show blockages and the status of the kidney. Ultrasounds are not associated with any radiation and can be performed easily in a matter of minutes. SEEK MEDICAL CARE IF:  You experience pain that is progressive and unresponsive to any pain medicine you have been prescribed. SEEK IMMEDIATE MEDICAL CARE IF:   Pain cannot be controlled with the prescribed medicine.  You have a fever or shaking chills.  The severity or intensity of pain increases  over 18 hours and is not relieved by pain medicine.  You develop a new onset of abdominal pain.  You feel faint or pass out.  You are unable to urinate. MAKE SURE YOU:   Understand these instructions.  Will watch your condition.  Will get help right away if you are  not doing well or get worse. Document Released: 02/23/2005 Document Revised: 10/26/2012 Document Reviewed: 07/27/2012 [facility] Patient Information 2015 [place], [place]. This information is not intended to replace advice given to you by your health care provider. Make sure you discuss any questions you have with your health care provider.

## 2013-12-04 NOTE — ED Notes (Signed)
Gave patient ice water as requested and ordered by MD.

## 2013-12-12 ENCOUNTER — Other Ambulatory Visit: Payer: Self-pay | Admitting: Urology

## 2013-12-12 ENCOUNTER — Ambulatory Visit (HOSPITAL_COMMUNITY)
Admission: RE | Admit: 2013-12-12 | Discharge: 2013-12-12 | Disposition: A | Discharge status: Home / Self Care | Payer: BC Managed Care – PPO | Source: Ambulatory Visit | Attending: Urology | Admitting: Urology

## 2013-12-12 ENCOUNTER — Ambulatory Visit (INDEPENDENT_AMBULATORY_CARE_PROVIDER_SITE_OTHER): Payer: BC Managed Care – PPO | Admitting: Urology

## 2013-12-12 DIAGNOSIS — Z87442 Personal history of urinary calculi: Secondary | ICD-10-CM | POA: Insufficient documentation

## 2013-12-12 DIAGNOSIS — N201 Calculus of ureter: Secondary | ICD-10-CM

## 2013-12-12 DIAGNOSIS — R1032 Left lower quadrant pain: Secondary | ICD-10-CM | POA: Insufficient documentation

## 2014-01-11 ENCOUNTER — Other Ambulatory Visit: Payer: Self-pay | Admitting: Urology

## 2014-01-11 ENCOUNTER — Other Ambulatory Visit: Payer: Self-pay | Admitting: Adult Health

## 2014-01-11 ENCOUNTER — Ambulatory Visit (HOSPITAL_COMMUNITY)
Admission: RE | Admit: 2014-01-11 | Discharge: 2014-01-11 | Disposition: A | Discharge status: Home / Self Care | Payer: BC Managed Care – PPO | Source: Ambulatory Visit | Attending: Urology | Admitting: Urology

## 2014-01-11 DIAGNOSIS — N201 Calculus of ureter: Secondary | ICD-10-CM

## 2014-01-16 ENCOUNTER — Ambulatory Visit (INDEPENDENT_AMBULATORY_CARE_PROVIDER_SITE_OTHER): Payer: BC Managed Care – PPO | Admitting: Urology

## 2014-01-16 DIAGNOSIS — N201 Calculus of ureter: Secondary | ICD-10-CM

## 2014-02-13 ENCOUNTER — Encounter: Payer: Self-pay | Admitting: *Deleted

## 2014-02-13 NOTE — Progress Notes (Signed)
HPI: Bryan Clark is a 43 y/o patient of Dr. Wyline Mood we are following for ongoing assessment and management of NICM, hypertension, with systolic CHF. He was last seen in the office in Feb of 2015. He was stable from cardiac standpoint. No changes were made in medication regimen.    Since being seen last, the patient has been seen by a [facility] it is planned to have a kidney stone removed from his bladder on 02/20/2014. The patient has run out of his medications for 2 weeks. This also includes his metformin, and EpiPen. He states his wife takes his blood pressure and it runs fairly normal. Hearing the office today his blood pressure is 120/82. He is requesting refills on his medications. He is offered no complaints of shortness of breath, dyspnea on exertion, or fluid retention. He states his blood glucoses been elevated at home. He has not had any labs since September 2015, during an ER visit for back pain, which diagnosis kidney stone. He is not yet found a primary care physician.  Allergies  Allergen Reactions  . Bee Venom Anaphylaxis    Current Outpatient Prescriptions  Medication Sig Dispense Refill  . aspirin EC 81 MG tablet Take 81 mg by mouth daily.    Marland Kitchen ibuprofen (ADVIL,MOTRIN) 800 MG tablet Take 1 tablet (800 mg total) by mouth 3 (three) times daily. 21 tablet 0  . Omega-3 Fatty Acids (FISH OIL) 500 MG CAPS Take 500 mg by mouth.    Marland Kitchen atorvastatin (LIPITOR) 40 MG tablet TAKE ONE TABLET BY MOUTH DAILY. (Patient not taking: Reported on 02/14/2014) 15 tablet 0  . carvedilol (COREG) 12.5 MG tablet TAKE 1 AND 1/2 TABLETS BY MOUTH TWICE DAILY WITH MEALS. (Patient not taking: Reported on 02/14/2014) 45 tablet 0  . furosemide (LASIX) 40 MG tablet TAKE ONE TABLET BY MOUTH ONCE DAILY. (Patient not taking: Reported on 02/14/2014) 30 tablet 0  . lisinopril (PRINIVIL,ZESTRIL) 10 MG tablet Take 10 mg by mouth daily.    . metFORMIN (GLUCOPHAGE) 500 MG tablet Take 500 mg by mouth daily.     .  Multiple Vitamin (MULTIVITAMIN WITH MINERALS) TABS tablet Take 1 tablet by mouth daily.     No current facility-administered medications for this visit.    Past Medical History  Diagnosis Date  . Cardiomyopathy     nonischemic ejection fraction 20% by cardiac cath  . Hypertension   . Acute systolic heart failure     exacerbation    Past Surgical History  Procedure Laterality Date  . Bilaterial pleural effusion    . Bladder surgery      trauma as a child during martial arts training    ROS: Complete review of systems performed and found to be negative unless outlined above  PHYSICAL EXAM BP 120/82 mmHg  Pulse 90  Ht 5\' 9"  (1.753 m)  Wt 223 lb (101.152 kg)  BMI 32.92 kg/m2 General: Well developed, well nourished, in no acute distress Head: Eyes PERRLA, No xanthomas.   Normal cephalic and atramatic  Lungs: Clear bilaterally to auscultation and percussion. Heart: HRRR S1 S2, without MRG.  Pulses are 2+ & equal.            No carotid bruit. No JVD.  No abdominal bruits. No femoral bruits. Abdomen: Bowel sounds are positive, abdomen soft and non-tender without masses or                  Hernia's noted. Msk:  Back normal, normal gait.  Normal strength and tone for age. Extremities: No clubbing, cyanosis or edema.  DP +1 Neuro: Alert and oriented X 3. Psych:  Good affect, responds appropriately   ASSESSMENT AND PLAN

## 2014-02-14 ENCOUNTER — Encounter: Payer: Self-pay | Admitting: Adult Health

## 2014-02-14 ENCOUNTER — Ambulatory Visit (INDEPENDENT_AMBULATORY_CARE_PROVIDER_SITE_OTHER): Payer: BC Managed Care – PPO | Admitting: Adult Health

## 2014-02-14 VITALS — BP 120/82 | HR 90 | Ht 69.0 in | Wt 223.0 lb

## 2014-02-14 DIAGNOSIS — I429 Cardiomyopathy, unspecified: Secondary | ICD-10-CM

## 2014-02-14 DIAGNOSIS — I10 Essential (primary) hypertension: Secondary | ICD-10-CM

## 2014-02-14 DIAGNOSIS — M3390 Dermatopolymyositis, unspecified, organ involvement unspecified: Secondary | ICD-10-CM

## 2014-02-14 DIAGNOSIS — E785 Hyperlipidemia, unspecified: Secondary | ICD-10-CM

## 2014-02-14 DIAGNOSIS — E118 Type 2 diabetes mellitus with unspecified complications: Secondary | ICD-10-CM

## 2014-02-14 MED ORDER — LISINOPRIL 2.5 MG PO TABS: 2.5000 mg | ORAL_TABLET | Freq: Every day | ORAL | Status: DC

## 2014-02-14 MED ORDER — FUROSEMIDE 20 MG PO TABS: ORAL_TABLET | ORAL | Status: DC

## 2014-02-14 MED ORDER — EPINEPHRINE 0.3 MG/0.3ML IJ SOAJ: 0.3000 mg | Freq: Once | INTRAMUSCULAR | Status: DC

## 2014-02-14 MED ORDER — METFORMIN HCL 500 MG PO TABS: 500.0000 mg | ORAL_TABLET | Freq: Every day | ORAL | Status: DC

## 2014-02-14 MED ORDER — CARVEDILOL 12.5 MG PO TABS: ORAL_TABLET | ORAL | Status: DC

## 2014-02-14 MED ORDER — ATORVASTATIN CALCIUM 40 MG PO TABS: 40.0000 mg | ORAL_TABLET | Freq: Every day | ORAL | Status: DC

## 2014-02-14 NOTE — Assessment & Plan Note (Signed)
I will give him refills on atorvastatin 40 mg daily. Recheck fasting lipids and LFTs. May need to increase dose should he not be well-controlled. Review of last labs did not show good control. He is advised on low sodium, low cholesterol diet. He states he has been walking every day.

## 2014-02-14 NOTE — Assessment & Plan Note (Signed)
Blood pressure is very well controlled despite not taking lisinopril carvedilol or Lasix for 2 weeks. He states, at home. His wife states his blood sugar runs around 105 -110 systolic. He denies any fluid retention.pressure today is 120/82.  I will refill his lisinopril, but drop the dose to 2.5 mg daily for renal protection. I will continue his carvedilol at 12.5 mg 1-1/2 tablet twice a day. I will have him come back in one month for reassessment of blood pressure and heart rate after taking medication. He knows to call or significant dizzy or lightheaded. Also, the patient will have a BMET drawn to evaluate for kidney function.

## 2014-02-14 NOTE — Assessment & Plan Note (Signed)
The patient denies any chest pain, weakness, or fatigue. Echocardiogram was completed on 04/19/2013 demonstrating a moderately reduced LV systolic function. Range of 45-50%. It grade 1 diastolic dysfunction. RV systolic function was mildly reduced. Trivial mitral regurg.  I will continue him on beta blocker. I will use Lasix when necessary instead of 40 mg daily as he does not have evidence of fluid retention. If stable. Check a BMET for kidney function. We will see him again in one month to assess patient's response to medications, as he has not been taking it for 2 weeks, and to review labs.

## 2014-02-14 NOTE — Patient Instructions (Signed)
Your physician recommends that you schedule a follow-up appointment in: 1 month  Your physician recommends that you return for lab work BMP/CBC/LIPIDS/LFT/HA1C  Your physician has recommended you make the following change in your medication:   DECREASE LASIX 20 MG AS NEEDED  DECREASE LISINOPRIL 2.5 MG DAILY  I HAVE REFILLED YOUR ATORVASTATIN, CARVEDILOL, METFORMIN, EPI PEN  Your physician recommends that you weigh, daily, at the same time every day, and in the same amount of clothing. Please record your daily weights on the handout provided and bring it to your next appointment.   Thank you for choosing [facility]!!

## 2014-02-14 NOTE — Assessment & Plan Note (Signed)
He is not yet found a primary care physician. I will refill his metformin 500 mg daily. We will check a hemoglobin A1c. May need to increase this dose depending on results. For now, he will be placed on his prior dose. He is advised to find a primary care physician. He does have insurance.it is my hope that he will be established soon.  Loss of give him refills on his EpiPen. 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

## 2014-02-14 NOTE — Progress Notes (Deleted)
Name: Bryan Clark    DOB: April 01, 1970  Age: 43 y.o.  MR#: [id number]       PCP:  No PCP Per Patient      Insurance: Payor: [employer] / Plan: BCBS [place] PPO / Product Type: *No Product type* /   CC:    Chief Complaint  Patient presents with  . Cardiomyopathy    NICM  . Hypertension  . Congestive Heart Failure    Systolic    VS Filed Vitals:   02/14/14 1431  BP: 120/82  Pulse: 90  Height: _0  (1.753 m)  Weight: 223 lb (101.152 kg)    Weights Current Weight  02/14/14 223 lb (101.152 kg)  12/04/13 218 lb (98.884 kg)  04/14/13 225 lb (102.059 kg)    Blood Pressure  BP Readings from Last 3 Encounters:  02/14/14 120/82  12/04/13 125/82  04/14/13 121/85     Admit date:  (Not on file) Last encounter with [facility]:  01/11/2014   Allergy Bee venom  Current Outpatient Prescriptions  Medication Sig Dispense Refill  . aspirin EC 81 MG tablet Take 81 mg by mouth daily.    Marland Kitchen ibuprofen (ADVIL,MOTRIN) 800 MG tablet Take 1 tablet (800 mg total) by mouth 3 (three) times daily. 21 tablet 0  . Omega-3 Fatty Acids (FISH OIL) 500 MG CAPS Take 500 mg by mouth.    Marland Kitchen atorvastatin (LIPITOR) 40 MG tablet TAKE ONE TABLET BY MOUTH DAILY. (Patient not taking: Reported on 02/14/2014) 15 tablet 0  . carvedilol (COREG) 12.5 MG tablet TAKE 1 AND 1/2 TABLETS BY MOUTH TWICE DAILY WITH MEALS. (Patient not taking: Reported on 02/14/2014) 45 tablet 0  . furosemide (LASIX) 40 MG tablet TAKE ONE TABLET BY MOUTH ONCE DAILY. (Patient not taking: Reported on 02/14/2014) 30 tablet 0  . lisinopril (PRINIVIL,ZESTRIL) 10 MG tablet Take 10 mg by mouth daily.    . metFORMIN (GLUCOPHAGE) 500 MG tablet Take 500 mg by mouth daily.     . Multiple Vitamin (MULTIVITAMIN WITH MINERALS) TABS tablet Take 1 tablet by mouth daily.     No current facility-administered medications for this visit.    Discontinued Meds:    Medications Discontinued During This Encounter  Medication Reason  . oxyCODONE-acetaminophen  (PERCOCET/ROXICET) 5-325 MG per tablet Error  . tamsulosin (FLOMAX) 0.4 MG CAPS capsule Error  . ondansetron (ZOFRAN) 4 MG tablet Error    Patient Active Problem List   Diagnosis Date Noted  . Diabetes 04/14/2013  . Nonischemic cardiomyopathy 07/11/2010  . Hypertension 07/11/2010  . Hypercholesterolemia 07/11/2010    LABS    Component Value Date/Time   NA 136* 12/04/2013 1948   NA 133* 04/15/2013 1001   NA 136 04/14/2012 0850   K 4.5 12/04/2013 1948   K 4.5 04/15/2013 1001   K 4.2 04/14/2012 0850   CL 95* 12/04/2013 1948   CL 95* 04/15/2013 1001   CL 101 04/14/2012 0850   CO2 27 12/04/2013 1948   CO2 27 04/15/2013 1001   CO2 28 04/14/2012 0850   GLUCOSE 450* 12/04/2013 1948   GLUCOSE 364* 04/15/2013 1001   GLUCOSE 212* 04/14/2012 0850   BUN 15 12/04/2013 1948   BUN 15 04/15/2013 1001   BUN 18 04/14/2012 0850   CREATININE 1.06 12/04/2013 1948   CREATININE 1.04 04/15/2013 1001   CREATININE 1.07 04/14/2012 0850   CREATININE 1.13 07/15/2011 1039   CREATININE 1.45 07/04/2010 0455   CREATININE 1.38 07/03/2010 0951   CALCIUM 9.6 12/04/2013 1948  CALCIUM 9.8 04/15/2013 1001   CALCIUM 9.7 04/14/2012 0850   GFRNONAA 85* 12/04/2013 1948   GFRNONAA 54* 07/04/2010 0455   GFRNONAA 57* 07/03/2010 0951   GFRAA >90 12/04/2013 1948   GFRAA  07/04/2010 0455    >60        The eGFR has been calculated using the MDRD equation. This calculation has not been validated in all clinical situations. eGFR's persistently <60 mL/min signify possible Chronic Kidney Disease.   GFRAA  07/03/2010 0951    >60        The eGFR has been calculated using the MDRD equation. This calculation has not been validated in all clinical situations. eGFR's persistently <60 mL/min signify possible Chronic Kidney Disease.   CMP     Component Value Date/Time   NA 136* 12/04/2013 1948   K 4.5 12/04/2013 1948   CL 95* 12/04/2013 1948   CO2 27 12/04/2013 1948   GLUCOSE 450* 12/04/2013 1948   BUN  15 12/04/2013 1948   CREATININE 1.06 12/04/2013 1948   CREATININE 1.04 04/15/2013 1001   CALCIUM 9.6 12/04/2013 1948   PROT 6.8 04/15/2013 1001   ALBUMIN 4.0 04/15/2013 1001   AST 31 04/15/2013 1001   ALT 42 04/15/2013 1001   ALKPHOS 84 04/15/2013 1001   BILITOT 0.4 04/15/2013 1001   GFRNONAA 85* 12/04/2013 1948   GFRAA >90 12/04/2013 1948       Component Value Date/Time   WBC 7.1 12/04/2013 1948   WBC 6.5 04/15/2013 1001   WBC 10.8* 07/01/2010 0340   HGB 16.6 12/04/2013 1948   HGB 16.3 04/15/2013 1001   HGB 14.8 07/01/2010 0340   HCT 47.9 12/04/2013 1948   HCT 47.5 04/15/2013 1001   HCT 43.1 07/01/2010 0340   MCV 90.5 12/04/2013 1948   MCV 90.3 04/15/2013 1001   MCV 89.0 07/01/2010 0340    Lipid Panel     Component Value Date/Time   CHOL 243* 04/15/2013 1001   TRIG 1277* 04/15/2013 1001   HDL 34* 04/15/2013 1001   CHOLHDL 7.1 04/15/2013 1001   VLDL NOT CALC 04/15/2013 1001   LDLCALC NOT CALC 04/15/2013 1001    ABG    Component Value Date/Time   PHART 7.405 07/03/2010 1025   PCO2ART 41.0 07/03/2010 1025   PO2ART 68.0* 07/03/2010 1025   HCO3 24.0 07/03/2010 1027   TCO2 25 07/03/2010 1027   ACIDBASEDEF 2.0 07/03/2010 1027   O2SAT 57.0 07/03/2010 1027     Lab Results  Component Value Date   TSH 1.046 07/02/2010   BNP (last 3 results) No results for input(s): PROBNP in the last 8760 hours. Cardiac Panel (last 3 results) No results for input(s): CKTOTAL, CKMB, TROPONINI, RELINDX in the last 72 hours.  Iron/TIBC/Ferritin/ %Sat    Component Value Date/Time   FERRITIN 116 07/01/2010 1543     EKG Orders placed or performed in visit on 04/14/13  . EKG 12-Lead     Prior Assessment and Plan Problem List as of 02/14/2014      Cardiovascular and Mediastinum   Nonischemic cardiomyopathy   Last Assessment & Plan   04/14/2013 Office Visit Written 04/14/2013  1:35 PM by Lendon Colonel, NP    He is doing well symptomatically. I will repeat echo for ongoing  assessment. He will continue ACE, BB and lasix. Will have BMET drawn for evaluation of his status.     Hypertension   Last Assessment & Plan   04/14/2013 Office Visit Written 04/14/2013  1:36 PM  by Lendon Colonel, NP    He is well controlled currently. Will continue current medications.       Endocrine   Diabetes   Last Assessment & Plan   04/14/2013 Office Visit Written 04/14/2013  1:38 PM by Lendon Colonel, NP    He does not have a PCP. I have given him Rx for metformin but is to be established with Dr Anastasio Champion for ongoing management. He will have Hgb A1C drawn. All labs will be sent to Dr. Anastasio Champion.       Other   Hypercholesterolemia   Last Assessment & Plan   04/14/2013 Office Visit Written 04/14/2013  1:37 PM by Lendon Colonel, NP    Fasting lipids and LFT's will be drawn in am.         Imaging: No results found.

## 2014-02-20 ENCOUNTER — Ambulatory Visit (INDEPENDENT_AMBULATORY_CARE_PROVIDER_SITE_OTHER): Payer: BC Managed Care – PPO | Admitting: Urology

## 2014-02-20 DIAGNOSIS — N201 Calculus of ureter: Secondary | ICD-10-CM

## 2014-03-16 ENCOUNTER — Encounter: Payer: BC Managed Care – PPO | Admitting: Adult Health

## 2014-03-16 ENCOUNTER — Encounter: Payer: Self-pay | Admitting: *Deleted

## 2014-03-16 NOTE — Progress Notes (Signed)
Error. No Show 

## 2014-03-22 ENCOUNTER — Encounter: Payer: Self-pay | Admitting: Adult Health

## 2014-03-22 ENCOUNTER — Ambulatory Visit (INDEPENDENT_AMBULATORY_CARE_PROVIDER_SITE_OTHER): Payer: BLUE CROSS/BLUE SHIELD | Admitting: Adult Health

## 2014-03-22 VITALS — BP 122/86 | HR 90 | Ht 69.0 in | Wt 222.0 lb

## 2014-03-22 DIAGNOSIS — E7800 Pure hypercholesterolemia, unspecified: Secondary | ICD-10-CM

## 2014-03-22 DIAGNOSIS — I428 Other cardiomyopathies: Secondary | ICD-10-CM

## 2014-03-22 DIAGNOSIS — I429 Cardiomyopathy, unspecified: Secondary | ICD-10-CM

## 2014-03-22 MED ORDER — FUROSEMIDE 20 MG PO TABS: 20.0000 mg | ORAL_TABLET | Freq: Every day | ORAL | Status: DC

## 2014-03-22 MED ORDER — CARVEDILOL 12.5 MG PO TABS: ORAL_TABLET | ORAL | Status: DC

## 2014-03-22 NOTE — Patient Instructions (Signed)
Your physician recommends that you schedule a follow-up appointment in: 4 months  Your physician recommends that you continue on your current medications as directed. Please refer to the Current Medication list given to you today.  Thank you for choosing [facility]!    

## 2014-03-22 NOTE — Progress Notes (Signed)
HPI: Mr. Fawcett is a 44 year old patient of Dr. Wyline Mood that we follow for ongoing assessment and management of nonischemic heart myopathy, hypertension, with a history of systolic CHF.  He was last seen in the office on 02/13/2014 prior to being admitted to have a stone removed from his bladder.  He also around his medications.  He is without complaint, last visit.  He was looking for a primary care physician.  He was given refills on all of his medications.  His lisinopril, was decreased to 2.5 mg daily, as he was normotensive on last office visit.most recent echocardiogram in January of 2015 demonstrated an EF of 45-50%.   Stay feeling much better.  He has been seen by the urologist and has had no more complaints since that time.  He is been established with Dr. Loreta Ave, at [facility] family [facility].  Dr. Loreta Ave has had some medications for his diabetic treatment.  The patient is also going to the gym 3 times a week with his brother, and eating healthfully.  He states he is feeling much better.  He offers no cardiac complaints.   He brings with him.  His most recent labs, which were dated 1 1 2016, all of which were essentially within normal limits with the exception of his glucose at 373.  LFTs were within normal limits.  Cholesterol revealed hyper triglyceridemia, total cholesterol 190, triglycerides 820, HDL 35, LDL was not able to results.  He is following closely with his primary care physician, for diabetic control, and weight loss.     Allergies  Allergen Reactions  . Bee Venom Anaphylaxis    Current Outpatient Prescriptions  Medication Sig Dispense Refill  . aspirin EC 81 MG tablet Take 81 mg by mouth daily.    Marland Kitchen atorvastatin (LIPITOR) 40 MG tablet Take 1 tablet (40 mg total) by mouth daily. 30 tablet 3  . carvedilol (COREG) 12.5 MG tablet TAKE 1 AND 1/2 TABLETS BY MOUTH TWICE DAILY WITH MEALS. 90 tablet 1  . Empagliflozin-Linagliptin 25-5 MG TABS Take 25 mg by mouth daily.    Marland Kitchen EPINEPHrine  0.3 mg/0.3 mL IJ SOAJ injection Inject 0.3 mLs (0.3 mg total) into the muscle once. 1 Device 0  . furosemide (LASIX) 20 MG tablet Take 1 tablet (20 mg total) by mouth daily. 30 tablet 6  . lisinopril (PRINIVIL,ZESTRIL) 2.5 MG tablet Take 1 tablet (2.5 mg total) by mouth daily. 90 tablet 3  . metFORMIN (GLUCOPHAGE) 500 MG tablet Take 1 tablet (500 mg total) by mouth daily. 30 tablet 0  . Multiple Vitamin (MULTIVITAMIN WITH MINERALS) TABS tablet Take 1 tablet by mouth daily.    . Omega-3 Fatty Acids (FISH OIL) 500 MG CAPS Take 500 mg by mouth.     No current facility-administered medications for this visit.    Past Medical History  Diagnosis Date  . Cardiomyopathy     nonischemic ejection fraction 20% by cardiac cath  . Hypertension   . Acute systolic heart failure     exacerbation    Past Surgical History  Procedure Laterality Date  . Bilaterial pleural effusion    . Bladder surgery      trauma as a child during martial arts training    ROS: .NROAS  PHYSICAL EXAM BP 122/86 mmHg  Pulse 90  Ht 5\' 9"  (1.753 m)  Wt 222 lb (100.699 kg)  BMI 32.77 kg/m2  SpO2 94% General: Well developed, well nourished, in no acute distress, obese    Head:  Eyes PERRLA, No xanthomas.   Normal cephalic and atramatic  Lungs: Clear bilaterally to auscultation and percussion. Heart: HRRR S1 S2, without MRG.  Pulses are 2+ & equal.            No carotid bruit. No JVD.  No abdominal bruits. No femoral bruits. Abdomen: Bowel sounds are positive, abdomen soft and non-tender without masses or                  Hernia's noted. Msk:  Back normal, normal gait. Normal strength and tone for age. Extremities: No clubbing, cyanosis or edema.  DP +1 Neuro: Alert and oriented X 3. Psych:  Good affect, responds appropriately   EKG:  nsr RATE PF 94 BPM.  ASSESSMENT AND PLAN

## 2014-03-22 NOTE — Progress Notes (Deleted)
Name: Bryan Clark    DOB: 12-Nov-1970  Age: 44 y.o.  MR#: [id number]       PCP:  No PCP Per Patient      Insurance: Payor: [employer] / Plan: BCBS OTHER / Product Type: *No Product type* /   CC:    Chief Complaint  Patient presents with  . Cardiomyopathy  . Hypertension    VS Filed Vitals:   03/22/14 1438  BP: 122/86  Pulse: 90  Height: '5\' 9"'  (1.753 m)  Weight: 222 lb (100.699 kg)  SpO2: 94%    Weights Current Weight  03/22/14 222 lb (100.699 kg)  02/14/14 223 lb (101.152 kg)  12/04/13 218 lb (98.884 kg)    Blood Pressure  BP Readings from Last 3 Encounters:  03/22/14 122/86  02/14/14 120/82  12/04/13 125/82     Admit date:  (Not on file) Last encounter with RMR:  02/14/2014   Allergy Bee venom  Current Outpatient Prescriptions  Medication Sig Dispense Refill  . aspirin EC 81 MG tablet Take 81 mg by mouth daily.    Marland Kitchen atorvastatin (LIPITOR) 40 MG tablet Take 1 tablet (40 mg total) by mouth daily. 30 tablet 3  . carvedilol (COREG) 12.5 MG tablet TAKE 1 AND 1/2 TABLETS BY MOUTH TWICE DAILY WITH MEALS. 90 tablet 1  . Empagliflozin-Linagliptin 25-5 MG TABS Take 25 mg by mouth daily.    Marland Kitchen EPINEPHrine 0.3 mg/0.3 mL IJ SOAJ injection Inject 0.3 mLs (0.3 mg total) into the muscle once. 1 Device 0  . furosemide (LASIX) 20 MG tablet As Needed 30 tablet 4  . ibuprofen (ADVIL,MOTRIN) 800 MG tablet Take 1 tablet (800 mg total) by mouth 3 (three) times daily. 21 tablet 0  . lisinopril (PRINIVIL,ZESTRIL) 2.5 MG tablet Take 1 tablet (2.5 mg total) by mouth daily. 90 tablet 3  . metFORMIN (GLUCOPHAGE) 500 MG tablet Take 1 tablet (500 mg total) by mouth daily. 30 tablet 0  . Multiple Vitamin (MULTIVITAMIN WITH MINERALS) TABS tablet Take 1 tablet by mouth daily.    . Omega-3 Fatty Acids (FISH OIL) 500 MG CAPS Take 500 mg by mouth.     No current facility-administered medications for this visit.    Discontinued Meds:   There are no discontinued medications.  Patient  Active Problem List   Diagnosis Date Noted  . Diabetes 04/14/2013  . Nonischemic cardiomyopathy 07/11/2010  . Hypertension 07/11/2010  . Hypercholesterolemia 07/11/2010    LABS    Component Value Date/Time   NA 136* 12/04/2013 1948   NA 133* 04/15/2013 1001   NA 136 04/14/2012 0850   K 4.5 12/04/2013 1948   K 4.5 04/15/2013 1001   K 4.2 04/14/2012 0850   CL 95* 12/04/2013 1948   CL 95* 04/15/2013 1001   CL 101 04/14/2012 0850   CO2 27 12/04/2013 1948   CO2 27 04/15/2013 1001   CO2 28 04/14/2012 0850   GLUCOSE 450* 12/04/2013 1948   GLUCOSE 364* 04/15/2013 1001   GLUCOSE 212* 04/14/2012 0850   BUN 15 12/04/2013 1948   BUN 15 04/15/2013 1001   BUN 18 04/14/2012 0850   CREATININE 1.06 12/04/2013 1948   CREATININE 1.04 04/15/2013 1001   CREATININE 1.07 04/14/2012 0850   CREATININE 1.13 07/15/2011 1039   CREATININE 1.45 07/04/2010 0455   CREATININE 1.38 07/03/2010 0951   CALCIUM 9.6 12/04/2013 1948   CALCIUM 9.8 04/15/2013 1001   CALCIUM 9.7 04/14/2012 0850   GFRNONAA 85* 12/04/2013 1948  GFRNONAA 54* 07/04/2010 0455   GFRNONAA 57* 07/03/2010 0951   GFRAA >90 12/04/2013 1948   GFRAA  07/04/2010 0455    >60        The eGFR has been calculated using the MDRD equation. This calculation has not been validated in all clinical situations. eGFR's persistently <60 mL/min signify possible Chronic Kidney Disease.   GFRAA  07/03/2010 0951    >60        The eGFR has been calculated using the MDRD equation. This calculation has not been validated in all clinical situations. eGFR's persistently <60 mL/min signify possible Chronic Kidney Disease.   CMP     Component Value Date/Time   NA 136* 12/04/2013 1948   K 4.5 12/04/2013 1948   CL 95* 12/04/2013 1948   CO2 27 12/04/2013 1948   GLUCOSE 450* 12/04/2013 1948   BUN 15 12/04/2013 1948   CREATININE 1.06 12/04/2013 1948   CREATININE 1.04 04/15/2013 1001   CALCIUM 9.6 12/04/2013 1948   PROT 6.8 04/15/2013 1001    ALBUMIN 4.0 04/15/2013 1001   AST 31 04/15/2013 1001   ALT 42 04/15/2013 1001   ALKPHOS 84 04/15/2013 1001   BILITOT 0.4 04/15/2013 1001   GFRNONAA 85* 12/04/2013 1948   GFRAA >90 12/04/2013 1948       Component Value Date/Time   WBC 7.1 12/04/2013 1948   WBC 6.5 04/15/2013 1001   WBC 10.8* 07/01/2010 0340   HGB 16.6 12/04/2013 1948   HGB 16.3 04/15/2013 1001   HGB 14.8 07/01/2010 0340   HCT 47.9 12/04/2013 1948   HCT 47.5 04/15/2013 1001   HCT 43.1 07/01/2010 0340   MCV 90.5 12/04/2013 1948   MCV 90.3 04/15/2013 1001   MCV 89.0 07/01/2010 0340    Lipid Panel     Component Value Date/Time   CHOL 243* 04/15/2013 1001   TRIG 1277* 04/15/2013 1001   HDL 34* 04/15/2013 1001   CHOLHDL 7.1 04/15/2013 1001   VLDL NOT CALC 04/15/2013 1001   LDLCALC NOT CALC 04/15/2013 1001    ABG    Component Value Date/Time   PHART 7.405 07/03/2010 1025   PCO2ART 41.0 07/03/2010 1025   PO2ART 68.0* 07/03/2010 1025   HCO3 24.0 07/03/2010 1027   TCO2 25 07/03/2010 1027   ACIDBASEDEF 2.0 07/03/2010 1027   O2SAT 57.0 07/03/2010 1027     Lab Results  Component Value Date   TSH 1.046 07/02/2010   BNP (last 3 results) No results for input(s): PROBNP in the last 8760 hours. Cardiac Panel (last 3 results) No results for input(s): CKTOTAL, CKMB, TROPONINI, RELINDX in the last 72 hours.  Iron/TIBC/Ferritin/ %Sat    Component Value Date/Time   FERRITIN 116 07/01/2010 1543     EKG Orders placed or performed in visit on 04/14/13  . EKG 12-Lead     Prior Assessment and Plan Problem List as of 03/22/2014      Cardiovascular and Mediastinum   Nonischemic cardiomyopathy   Last Assessment & Plan 02/14/2014 Office Visit Written 02/14/2014  3:10 PM by Lendon Colonel, NP    The patient denies any chest pain, weakness, or fatigue. Echocardiogram was completed on 04/19/2013 demonstrating a moderately reduced LV systolic function. Range of 45-50%. It grade 1 diastolic dysfunction. RV systolic  function was mildly reduced. Trivial mitral regurg.  I will continue him on beta blocker. I will use Lasix when necessary instead of 40 mg daily as he does not have evidence of fluid retention. If stable. Check  a BMET for kidney function. We will see him again in one month to assess patient's response to medications, as he has not been taking it for 2 weeks, and to review labs.      Hypertension   Last Assessment & Plan 02/14/2014 Office Visit Written 02/14/2014  3:09 PM by Lendon Colonel, NP    Blood pressure is very well controlled despite not taking lisinopril carvedilol or Lasix for 2 weeks. He states, at home. His wife states his blood sugar runs around 782 -423 systolic. He denies any fluid retention.pressure today is 120/82.  I will refill his lisinopril, but drop the dose to 2.5 mg daily for renal protection. I will continue his carvedilol at 12.5 mg 1-1/2 tablet twice a day. I will have him come back in one month for reassessment of blood pressure and heart rate after taking medication. He knows to call or significant dizzy or lightheaded. Also, the patient will have a BMET drawn to evaluate for kidney function.        Endocrine   Diabetes   Last Assessment & Plan 02/14/2014 Office Visit Written 02/14/2014  3:13 PM by Lendon Colonel, NP    He is not yet found a primary care physician. I will refill his metformin 500 mg daily. We will check a hemoglobin A1c. May need to increase this dose depending on results. For now, he will be placed on his prior dose. He is advised to find a primary care physician. He does have insurance.it is my hope that he will be established soon.  Loss of give him refills on his EpiPen. 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        Other   Hypercholesterolemia   Last Assessment & Plan 02/14/2014 Office Visit Written 02/14/2014  3:11 PM by Lendon Colonel, NP    I will give him refills on atorvastatin 40 mg daily. Recheck  fasting lipids and LFTs. May need to increase dose should he not be well-controlled. Review of last labs did not show good control. He is advised on low sodium, low cholesterol diet. He states he has been walking every day.          Imaging: No results found.

## 2014-03-22 NOTE — Assessment & Plan Note (Signed)
He is a rare well from a cardiac standpoint.  Heart rate and blood pressure well controlled.  He is medically compliant.  He is recently begun an exercise program.  We will see him again in 4 months.  If he begins to lose weight.  We may need to adjust his medications to avoid dehydration and hypotension.  He appears to be very motivated.

## 2014-03-22 NOTE — Assessment & Plan Note (Signed)
He has mixed hyperlipidemia.  He is being followed by his primary care physician, Dr. Loreta Ave, who is adjusting his diabetic medication.  The patient was then placed on a very strict low cholesterol diabetic diet.  He has been on Ziac for approximately 2 weeks and is adhering to it.  Dr. Loreta Ave will repeat his labs on followup.

## 2014-05-14 ENCOUNTER — Other Ambulatory Visit: Payer: Self-pay | Admitting: Adult Health

## 2014-05-14 NOTE — Telephone Encounter (Signed)
Please see refill bin / tg  °

## 2014-05-14 NOTE — Telephone Encounter (Signed)
Clarified  with pharmacy Lasix 20 mg daily

## 2014-06-13 ENCOUNTER — Other Ambulatory Visit: Payer: Self-pay | Admitting: Adult Health

## 2014-08-11 ENCOUNTER — Other Ambulatory Visit: Payer: Self-pay | Admitting: Adult Health

## 2014-09-03 ENCOUNTER — Other Ambulatory Visit: Payer: Self-pay

## 2014-09-27 ENCOUNTER — Other Ambulatory Visit: Payer: Self-pay | Admitting: Adult Health

## 2014-11-12 ENCOUNTER — Other Ambulatory Visit: Payer: Self-pay | Admitting: Adult Health

## 2015-01-01 ENCOUNTER — Other Ambulatory Visit: Payer: Self-pay | Admitting: Adult Health

## 2015-01-01 ENCOUNTER — Other Ambulatory Visit: Payer: Self-pay

## 2015-01-01 MED ORDER — FUROSEMIDE 20 MG PO TABS: 20.0000 mg | ORAL_TABLET | Freq: Every day | ORAL | Status: DC

## 2015-05-02 ENCOUNTER — Ambulatory Visit (INDEPENDENT_AMBULATORY_CARE_PROVIDER_SITE_OTHER): Payer: BLUE CROSS/BLUE SHIELD | Admitting: Adult Health

## 2015-05-02 ENCOUNTER — Encounter: Payer: Self-pay | Admitting: Adult Health

## 2015-05-02 VITALS — BP 122/80 | HR 83 | Ht 69.0 in | Wt 219.0 lb

## 2015-05-02 DIAGNOSIS — I420 Dilated cardiomyopathy: Secondary | ICD-10-CM | POA: Diagnosis not present

## 2015-05-02 NOTE — Progress Notes (Deleted)
Cardiology Office Note   Date:  05/02/2015   ID:  Bryan Clark, DOB 1970-11-19, MRN [id number]  PCP:  Lenise Herald, PA-C  Cardiologist: Arlington Calix, NP   No chief complaint on file.     History of Present Illness: Bryan Clark is a 45 y.o. male who presents for ongoing assessment and management of nonischemic cardiomyopathy, hypertension, history of systolic heart failure.  The patient was last seen in the office on 03/22/2014 and was doing much better.  He recently been admitted to the hospital in December to have a kidney stone removed from his bladder. No changes were made in his medications.  He is here for one-month followup   He is without complaint today, no shortness of breath, no chest pain, bleeding, issues, or cramping.  He is being followed by Lenise Herald, physician assistant for diabetes, and has had recent labs.    Past Medical History  Diagnosis Date  . Cardiomyopathy     nonischemic ejection fraction 20% by cardiac cath  . Hypertension   . Acute systolic heart failure (HCC)     exacerbation    Past Surgical History  Procedure Laterality Date  . Bilaterial pleural effusion    . Bladder surgery      trauma as a child during martial arts training     Current Outpatient Prescriptions  Medication Sig Dispense Refill  . aspirin EC 81 MG tablet Take 81 mg by mouth daily.    Marland Kitchen atorvastatin (LIPITOR) 40 MG tablet Take 1 tablet (40 mg total) by mouth daily. 30 tablet 3  . carvedilol (COREG) 12.5 MG tablet TAKE 1 AND 1/2 TABLETS BY MOUTH TWICE DAILY WITH MEALS. 90 tablet 3  . Empagliflozin-Linagliptin 25-5 MG TABS Take 25 mg by mouth daily.    Marland Kitchen EPINEPHrine 0.3 mg/0.3 mL IJ SOAJ injection Inject 0.3 mLs (0.3 mg total) into the muscle once. 1 Device 0  . furosemide (LASIX) 20 MG tablet Take 1 tablet (20 mg total) by mouth daily. 30 tablet 0  . lisinopril (PRINIVIL,ZESTRIL) 2.5 MG tablet Take 1 tablet (2.5 mg total) by mouth daily. 90 tablet 3  .  metFORMIN (GLUCOPHAGE) 500 MG tablet Take 1 tablet (500 mg total) by mouth daily. (Patient taking differently: Take 500 mg by mouth 2 (two) times daily with a meal. ) 30 tablet 0  . Multiple Vitamin (MULTIVITAMIN WITH MINERALS) TABS tablet Take 1 tablet by mouth daily.    . Omega-3 Fatty Acids (FISH OIL) 500 MG CAPS Take 500 mg by mouth.     No current facility-administered medications for this visit.    Allergies:   Bee venom    Social History:  The patient  reports that he has never smoked. He has never used smokeless tobacco. He reports that he does not drink alcohol or use illicit drugs.   Family History:  The patient's family history is not on file.    ROS: All other systems are reviewed and negative. Unless otherwise mentioned in H&P    PHYSICAL EXAM: VS:  BP 122/80 mmHg  Pulse 83  Ht 5\' 9"  (1.753 m)  Wt 219 lb (99.338 kg)  BMI 32.33 kg/m2  SpO2 94% , BMI Body mass index is 32.33 kg/(m^2). GEN: Well nourished, well developed, in no acute distress HEENT: normal Neck: no JVD, carotid bruits, or masses Cardiac: RRR; no murmurs, rubs, or gallops,no edema  Respiratory:  clear to auscultation bilaterally, normal work of breathing GI: soft, nontender, nondistended, + BS obese.  MS: no deformity or atrophy Skin: warm and dry, no rash Neuro:  Strength and sensation are intact Psych: euthymic mood, full affect  Recent Labs: No results found for requested labs within last 365 days.    Lipid Panel    Component Value Date/Time   CHOL 243* 04/15/2013 1001   TRIG 1277* 04/15/2013 1001   HDL 34* 04/15/2013 1001   CHOLHDL 7.1 04/15/2013 1001   VLDL NOT CALC 04/15/2013 1001   LDLCALC NOT CALC 04/15/2013 1001      Wt Readings from Last 3 Encounters:  05/02/15 219 lb (99.338 kg)  03/22/14 222 lb (100.699 kg)  02/14/14 223 lb (101.152 kg)     ASSESSMENT AND PLAN:  1.  Nonischemic cardiomyopathy: he is stable from a cardiac standpoint, was normal.  Blood pressure and heart  rate.  I will not make any changes at this time.  He will continue on the carvedilol, Lasix, and ACE inhibitor.  Recent labs have been completed by his primary care provider, and therefore, no further labs will be completed.  We will see him again in 9 months unless he is symptomatic.  2. Hypertension:doing well.  Blood pressure has excellent control.  He will continue on his current medication regimen.   Current medicines are reviewed at length with the patient today.    Labs/ tests ordered today include:  No orders of the defined types were placed in this encounter.     Disposition:   FU with 9 months  Signed, Joni Reining, NP  05/02/2015 2:22 PM     [facility] [address] [place], [place] [address] Phone: [phone]; Fax: [phone]

## 2015-05-02 NOTE — Patient Instructions (Signed)
Your physician wants you to follow-up in: 9 Months with Dr. Tenny Craw.  You will receive a reminder letter in the mail two months in advance. If you don't receive a letter, please call our office to schedule the follow-up appointment.  Your physician recommends that you continue on your current medications as directed. Please refer to the Current Medication list given to you today.  If you need a refill on your cardiac medications before your next appointment, please call your pharmacy.  Thank you for choosing [facility]!

## 2015-05-02 NOTE — Progress Notes (Signed)
Cardiology Office Note   Date:  05/02/2015   ID:  MCKOY TARGETT, DOB 01-Aug-1970, MRN [id number]  PCP:  Lenise Herald, PA-C  Cardiologist: Arlington Calix, NP   No chief complaint on file.     History of Present Illness: Bryan Clark is a 45 y.o. male who presents for ongoing assessment and management of hypertension, and nonischemic cardiomyopathy.  He comes today without complaint.  He is doing well from a cardiovascular standpoint.  He denies chest pain, dyspnea on exertion, or fluid retention.  He is being followed by Lenise Herald, physician assistant for diabetes management    Past Medical History  Diagnosis Date  . Cardiomyopathy     nonischemic ejection fraction 20% by cardiac cath  . Hypertension   . Acute systolic heart failure (HCC)     exacerbation    Past Surgical History  Procedure Laterality Date  . Bilaterial pleural effusion    . Bladder surgery      trauma as a child during martial arts training     Current Outpatient Prescriptions  Medication Sig Dispense Refill  . aspirin EC 81 MG tablet Take 81 mg by mouth daily.    Marland Kitchen atorvastatin (LIPITOR) 40 MG tablet Take 1 tablet (40 mg total) by mouth daily. 30 tablet 3  . carvedilol (COREG) 12.5 MG tablet TAKE 1 AND 1/2 TABLETS BY MOUTH TWICE DAILY WITH MEALS. 90 tablet 3  . Empagliflozin-Linagliptin 25-5 MG TABS Take 25 mg by mouth daily.    Marland Kitchen EPINEPHrine 0.3 mg/0.3 mL IJ SOAJ injection Inject 0.3 mLs (0.3 mg total) into the muscle once. 1 Device 0  . furosemide (LASIX) 20 MG tablet Take 1 tablet (20 mg total) by mouth daily. 30 tablet 0  . lisinopril (PRINIVIL,ZESTRIL) 2.5 MG tablet Take 1 tablet (2.5 mg total) by mouth daily. 90 tablet 3  . metFORMIN (GLUCOPHAGE) 500 MG tablet Take 1 tablet (500 mg total) by mouth daily. (Patient taking differently: Take 500 mg by mouth 2 (two) times daily with a meal. ) 30 tablet 0  . Multiple Vitamin (MULTIVITAMIN WITH MINERALS) TABS tablet Take 1 tablet by  mouth daily.    . Omega-3 Fatty Acids (FISH OIL) 500 MG CAPS Take 500 mg by mouth.     No current facility-administered medications for this visit.    Allergies:   Bee venom    Social History:  The patient  reports that he has never smoked. He has never used smokeless tobacco. He reports that he does not drink alcohol or use illicit drugs.   Family History:  The patient's family history is not on file.    ROS: All other systems are reviewed and negative. Unless otherwise mentioned in H&P    PHYSICAL EXAM: VS:  BP 122/80 mmHg  Pulse 83  Ht 5\' 9"  (1.753 m)  Wt 219 lb (99.338 kg)  BMI 32.33 kg/m2  SpO2 94% , BMI Body mass index is 32.33 kg/(m^2). GEN: Well nourished, well developed, in no acute distress HEENT: normal Neck: no JVD, carotid bruits, or masses Cardiac: RRR; no murmurs, rubs, or gallops,no edema  Respiratory:  clear to auscultation bilaterally, normal work of breathing GI: soft, nontender, nondistended, + BS obese. MS: no deformity or atrophy Skin: warm and dry, no rash Neuro:  Strength and sensation are intact Psych: euthymic mood, full affect   Recent Labs: No results found for requested labs within last 365 days.    Lipid Panel    Component Value  Date/Time   CHOL 243* 04/15/2013 1001   TRIG 1277* 04/15/2013 1001   HDL 34* 04/15/2013 1001   CHOLHDL 7.1 04/15/2013 1001   VLDL NOT CALC 04/15/2013 1001   LDLCALC NOT CALC 04/15/2013 1001      Wt Readings from Last 3 Encounters:  05/02/15 219 lb (99.338 kg)  03/22/14 222 lb (100.699 kg)  02/14/14 223 lb (101.152 kg)     ASSESSMENT AND PLAN:  1.  Nonischemic cardiomyopathy: he appears to be well compensated and stable.  We will continue him on beta blocker, ACE, and diuretic.  He said recent labs her primary care provider, therefore, I will not repeat them.  We will see him again in 9 months unless he is symptomatic.  2. Hypertension:excellent control of blood pressure.  Continue current medication  regimen.  We will see him again in 9 months.  May consider repeating echocardiogram at that time to evaluate LV systolic function.   Current medicines are reviewed at length with the patient today.    Labs/ tests ordered today include:  No orders of the defined types were placed in this encounter.     Disposition:   FU with 9 months   Signed, Joni Reining, NP  05/02/2015 2:31 PM    [facility] [address] [place], [place] [address] Phone: [phone]; Fax: [phone]

## 2015-05-02 NOTE — Progress Notes (Deleted)
Name: Bryan Clark    DOB: 04-20-1970  Age: 45 y.o.  MR#: [id number]       PCP:  Collene Mares, PA-C      Insurance: Payor: [place] / Plan: BCBS OTHER / Product Type: *No Product type* /   CC:   No chief complaint on file.   VS Filed Vitals:   05/02/15 1409  BP: 122/80  Pulse: 83  Height: '5\' 9"'  (1.753 m)  Weight: 219 lb (99.338 kg)  SpO2: 94%    Weights Current Weight  05/02/15 219 lb (99.338 kg)  03/22/14 222 lb (100.699 kg)  02/14/14 223 lb (101.152 kg)    Blood Pressure  BP Readings from Last 3 Encounters:  05/02/15 122/80  03/22/14 122/86  02/14/14 120/82     Admit date:  (Not on file) Last encounter with RMR:  01/01/2015   Allergy Bee venom  Current Outpatient Prescriptions  Medication Sig Dispense Refill  . aspirin EC 81 MG tablet Take 81 mg by mouth daily.    Marland Kitchen atorvastatin (LIPITOR) 40 MG tablet Take 1 tablet (40 mg total) by mouth daily. 30 tablet 3  . carvedilol (COREG) 12.5 MG tablet TAKE 1 AND 1/2 TABLETS BY MOUTH TWICE DAILY WITH MEALS. 90 tablet 3  . Empagliflozin-Linagliptin 25-5 MG TABS Take 25 mg by mouth daily.    Marland Kitchen EPINEPHrine 0.3 mg/0.3 mL IJ SOAJ injection Inject 0.3 mLs (0.3 mg total) into the muscle once. 1 Device 0  . furosemide (LASIX) 20 MG tablet Take 1 tablet (20 mg total) by mouth daily. 30 tablet 0  . lisinopril (PRINIVIL,ZESTRIL) 2.5 MG tablet Take 1 tablet (2.5 mg total) by mouth daily. 90 tablet 3  . metFORMIN (GLUCOPHAGE) 500 MG tablet Take 1 tablet (500 mg total) by mouth daily. (Patient taking differently: Take 500 mg by mouth 2 (two) times daily with a meal. ) 30 tablet 0  . Multiple Vitamin (MULTIVITAMIN WITH MINERALS) TABS tablet Take 1 tablet by mouth daily.    . Omega-3 Fatty Acids (FISH OIL) 500 MG CAPS Take 500 mg by mouth.     No current facility-administered medications for this visit.    Discontinued Meds:   There are no discontinued medications.  Patient Active Problem List   Diagnosis Date Noted   . Diabetes ([place]) 04/14/2013  . Nonischemic cardiomyopathy ([place]) 07/11/2010  . Hypertension 07/11/2010  . Hypercholesterolemia 07/11/2010    LABS    Component Value Date/Time   NA 136* 12/04/2013 1948   NA 133* 04/15/2013 1001   NA 136 04/14/2012 0850   K 4.5 12/04/2013 1948   K 4.5 04/15/2013 1001   K 4.2 04/14/2012 0850   CL 95* 12/04/2013 1948   CL 95* 04/15/2013 1001   CL 101 04/14/2012 0850   CO2 27 12/04/2013 1948   CO2 27 04/15/2013 1001   CO2 28 04/14/2012 0850   GLUCOSE 450* 12/04/2013 1948   GLUCOSE 364* 04/15/2013 1001   GLUCOSE 212* 04/14/2012 0850   BUN 15 12/04/2013 1948   BUN 15 04/15/2013 1001   BUN 18 04/14/2012 0850   CREATININE 1.06 12/04/2013 1948   CREATININE 1.04 04/15/2013 1001   CREATININE 1.07 04/14/2012 0850   CREATININE 1.13 07/15/2011 1039   CREATININE 1.45 07/04/2010 0455   CREATININE 1.38 07/03/2010 0951   CALCIUM 9.6 12/04/2013 1948   CALCIUM 9.8 04/15/2013 1001   CALCIUM 9.7 04/14/2012 0850   GFRNONAA 85* 12/04/2013 1948   GFRNONAA 54* 07/04/2010 0455   GFRNONAA  57* 07/03/2010 0951   GFRAA >90 12/04/2013 1948   GFRAA  07/04/2010 0455    >60        The eGFR has been calculated using the MDRD equation. This calculation has not been validated in all clinical situations. eGFR's persistently <60 mL/min signify possible Chronic Kidney Disease.   GFRAA  07/03/2010 0951    >60        The eGFR has been calculated using the MDRD equation. This calculation has not been validated in all clinical situations. eGFR's persistently <60 mL/min signify possible Chronic Kidney Disease.   CMP     Component Value Date/Time   NA 136* 12/04/2013 1948   K 4.5 12/04/2013 1948   CL 95* 12/04/2013 1948   CO2 27 12/04/2013 1948   GLUCOSE 450* 12/04/2013 1948   BUN 15 12/04/2013 1948   CREATININE 1.06 12/04/2013 1948   CREATININE 1.04 04/15/2013 1001   CALCIUM 9.6 12/04/2013 1948   PROT 6.8 04/15/2013 1001   ALBUMIN 4.0 04/15/2013 1001    AST 31 04/15/2013 1001   ALT 42 04/15/2013 1001   ALKPHOS 84 04/15/2013 1001   BILITOT 0.4 04/15/2013 1001   GFRNONAA 85* 12/04/2013 1948   GFRAA >90 12/04/2013 1948       Component Value Date/Time   WBC 7.1 12/04/2013 1948   WBC 6.5 04/15/2013 1001   WBC 10.8* 07/01/2010 0340   HGB 16.6 12/04/2013 1948   HGB 16.3 04/15/2013 1001   HGB 14.8 07/01/2010 0340   HCT 47.9 12/04/2013 1948   HCT 47.5 04/15/2013 1001   HCT 43.1 07/01/2010 0340   MCV 90.5 12/04/2013 1948   MCV 90.3 04/15/2013 1001   MCV 89.0 07/01/2010 0340    Lipid Panel     Component Value Date/Time   CHOL 243* 04/15/2013 1001   TRIG 1277* 04/15/2013 1001   HDL 34* 04/15/2013 1001   CHOLHDL 7.1 04/15/2013 1001   VLDL NOT CALC 04/15/2013 1001   LDLCALC NOT CALC 04/15/2013 1001    ABG    Component Value Date/Time   PHART 7.405 07/03/2010 1025   PCO2ART 41.0 07/03/2010 1025   PO2ART 68.0* 07/03/2010 1025   HCO3 24.0 07/03/2010 1027   TCO2 25 07/03/2010 1027   ACIDBASEDEF 2.0 07/03/2010 1027   O2SAT 57.0 07/03/2010 1027     Lab Results  Component Value Date   TSH 1.046 07/02/2010   BNP (last 3 results) No results for input(s): BNP in the last 8760 hours.  ProBNP (last 3 results) No results for input(s): PROBNP in the last 8760 hours.  Cardiac Panel (last 3 results) No results for input(s): CKTOTAL, CKMB, TROPONINI, RELINDX in the last 72 hours.  Iron/TIBC/Ferritin/ %Sat    Component Value Date/Time   FERRITIN 116 07/01/2010 1543     EKG Orders placed or performed in visit on 03/22/14  . EKG 12-Lead     Prior Assessment and Plan Problem List as of 05/02/2015      Cardiovascular and Mediastinum   Nonischemic cardiomyopathy [facility])   Last Assessment & Plan 03/22/2014 Office Visit Written 03/22/2014  4:45 PM by Lendon Colonel, NP    He is a rare well from a cardiac standpoint.  Heart rate and blood pressure well controlled.  He is medically compliant.  He is recently begun an exercise  program.  We will see him again in 4 months.  If he begins to lose weight.  We may need to adjust his medications to avoid dehydration and hypotension.  He appears to be very motivated.      Hypertension   Last Assessment & Plan 02/14/2014 Office Visit Written 02/14/2014  3:09 PM by Lendon Colonel, NP    Blood pressure is very well controlled despite not taking lisinopril carvedilol or Lasix for 2 weeks. He states, at home. His wife states his blood sugar runs around 184 -037 systolic. He denies any fluid retention.pressure today is 120/82.  I will refill his lisinopril, but drop the dose to 2.5 mg daily for renal protection. I will continue his carvedilol at 12.5 mg 1-1/2 tablet twice a day. I will have him come back in one month for reassessment of blood pressure and heart rate after taking medication. He knows to call or significant dizzy or lightheaded. Also, the patient will have a BMET drawn to evaluate for kidney function.        Endocrine   Diabetes [facility])   Last Assessment & Plan 02/14/2014 Office Visit Written 02/14/2014  3:13 PM by Lendon Colonel, NP    He is not yet found a primary care physician. I will refill his metformin 500 mg daily. We will check a hemoglobin A1c. May need to increase this dose depending on results. For now, he will be placed on his prior dose. He is advised to find a primary care physician. He does have insurance.it is my hope that he will be established soon.  Loss of give him refills on his EpiPen. 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        Other   Hypercholesterolemia   Last Assessment & Plan 03/22/2014 Office Visit Written 03/22/2014  4:45 PM by Lendon Colonel, NP    He has mixed hyperlipidemia.  He is being followed by his primary care physician, Dr. Collene Mares, who is adjusting his diabetic medication.  The patient was then placed on a very strict low cholesterol diabetic diet.  He has been on Ziac for approximately 2  weeks and is adhering to it.  Dr. Collene Mares will repeat his labs on followup.          Imaging: No results found.

## 2016-01-13 ENCOUNTER — Ambulatory Visit: Payer: BLUE CROSS/BLUE SHIELD | Admitting: Internal Medicine

## 2016-03-05 ENCOUNTER — Ambulatory Visit: Payer: BLUE CROSS/BLUE SHIELD | Admitting: Internal Medicine

## 2016-03-18 DIAGNOSIS — I10 Essential (primary) hypertension: Secondary | ICD-10-CM | POA: Diagnosis not present

## 2016-03-18 DIAGNOSIS — Z1389 Encounter for screening for other disorder: Secondary | ICD-10-CM | POA: Diagnosis not present

## 2016-03-18 DIAGNOSIS — E669 Obesity, unspecified: Secondary | ICD-10-CM | POA: Diagnosis not present

## 2016-03-18 DIAGNOSIS — Z6833 Body mass index (BMI) 33.0-33.9, adult: Secondary | ICD-10-CM | POA: Diagnosis not present

## 2016-03-18 DIAGNOSIS — E1165 Type 2 diabetes mellitus with hyperglycemia: Secondary | ICD-10-CM | POA: Diagnosis not present

## 2016-04-10 ENCOUNTER — Encounter: Payer: Self-pay | Admitting: Internal Medicine

## 2016-04-10 ENCOUNTER — Ambulatory Visit: Payer: BLUE CROSS/BLUE SHIELD | Admitting: Internal Medicine

## 2016-04-27 ENCOUNTER — Encounter: Payer: Self-pay | Admitting: Adult Health

## 2016-04-27 ENCOUNTER — Ambulatory Visit (INDEPENDENT_AMBULATORY_CARE_PROVIDER_SITE_OTHER): Payer: BLUE CROSS/BLUE SHIELD | Admitting: Adult Health

## 2016-04-27 VITALS — BP 124/80 | HR 85 | Ht 69.0 in | Wt 217.0 lb

## 2016-04-27 DIAGNOSIS — I10 Essential (primary) hypertension: Secondary | ICD-10-CM

## 2016-04-27 DIAGNOSIS — E785 Hyperlipidemia, unspecified: Secondary | ICD-10-CM

## 2016-04-27 DIAGNOSIS — E109 Type 1 diabetes mellitus without complications: Secondary | ICD-10-CM

## 2016-04-27 MED ORDER — ATORVASTATIN CALCIUM 40 MG PO TABS: 40.0000 mg | ORAL_TABLET | Freq: Every day | ORAL | 3 refills | Status: DC

## 2016-04-27 MED ORDER — CARVEDILOL 12.5 MG PO TABS: ORAL_TABLET | ORAL | 3 refills | Status: DC

## 2016-04-27 MED ORDER — LISINOPRIL 2.5 MG PO TABS
2.5000 mg | ORAL_TABLET | Freq: Every day | ORAL | 3 refills | Status: DC
Start: 2016-04-27 — End: 2021-02-10

## 2016-04-27 MED ORDER — FUROSEMIDE 20 MG PO TABS: 20.0000 mg | ORAL_TABLET | Freq: Every day | ORAL | 0 refills | Status: DC

## 2016-04-27 NOTE — Progress Notes (Signed)
Name: Bryan Clark    DOB: 02/11/1971  Age: 46 y.o.  MR#: [id number]       PCP:  [place]: Payor: [place] / Plan: BCBS OTHER / Product Type: *No Product type* /   CC:   No chief complaint on file.   VS Vitals:   04/27/16 1454  BP: 124/80  Pulse: 85  SpO2: 96%  Weight: 217 lb (98.4 kg)  Height: '5\' 9"'  (1.753 m)    Weights Current Weight  04/27/16 217 lb (98.4 kg)  05/02/15 219 lb (99.3 kg)  03/22/14 222 lb (100.7 kg)    Blood Pressure  BP Readings from Last 3 Encounters:  04/27/16 124/80  05/02/15 122/80  03/22/14 122/86     Admit date:  (Not on file) Last encounter with RMR:  Visit date not found   Allergy Bee venom  Current Outpatient Prescriptions  Medication Sig Dispense Refill  . aspirin EC 81 MG tablet Take 81 mg by mouth daily.    Marland Kitchen atorvastatin (LIPITOR) 40 MG tablet Take 1 tablet (40 mg total) by mouth daily. 30 tablet 3  . carvedilol (COREG) 12.5 MG tablet TAKE 1 AND 1/2 TABLETS BY MOUTH TWICE DAILY WITH MEALS. 90 tablet 3  . Empagliflozin-Linagliptin 25-5 MG TABS Take 25 mg by mouth daily.    Marland Kitchen EPINEPHrine 0.3 mg/0.3 mL IJ SOAJ injection Inject 0.3 mLs (0.3 mg total) into the muscle once. 1 Device 0  . furosemide (LASIX) 20 MG tablet Take 1 tablet (20 mg total) by mouth daily. 30 tablet 0  . lisinopril (PRINIVIL,ZESTRIL) 2.5 MG tablet Take 1 tablet (2.5 mg total) by mouth daily. 90 tablet 3  . metFORMIN (GLUCOPHAGE) 500 MG tablet Take 1 tablet (500 mg total) by mouth daily. (Patient taking differently: Take 500 mg by mouth 2 (two) times daily with a meal. ) 30 tablet 0  . Multiple Vitamin (MULTIVITAMIN WITH MINERALS) TABS tablet Take 1 tablet by mouth daily.    . Omega-3 Fatty Acids (FISH OIL) 500 MG CAPS Take 500 mg by mouth.     No current facility-administered medications for this visit.     Discontinued Meds:   There are no discontinued medications.  Patient Active Problem List   Diagnosis Date  Noted  . Diabetes ([place]) 04/14/2013  . Nonischemic cardiomyopathy ([place]) 07/11/2010  . Hypertension 07/11/2010  . Hypercholesterolemia 07/11/2010    LABS    Component Value Date/Time   NA 136 (L) 12/04/2013 1948   NA 133 (L) 04/15/2013 1001   NA 136 04/14/2012 0850   K 4.5 12/04/2013 1948   K 4.5 04/15/2013 1001   K 4.2 04/14/2012 0850   CL 95 (L) 12/04/2013 1948   CL 95 (L) 04/15/2013 1001   CL 101 04/14/2012 0850   CO2 27 12/04/2013 1948   CO2 27 04/15/2013 1001   CO2 28 04/14/2012 0850   GLUCOSE 450 (H) 12/04/2013 1948   GLUCOSE 364 (H) 04/15/2013 1001   GLUCOSE 212 (H) 04/14/2012 0850   BUN 15 12/04/2013 1948   BUN 15 04/15/2013 1001   BUN 18 04/14/2012 0850   CREATININE 1.06 12/04/2013 1948   CREATININE 1.04 04/15/2013 1001   CREATININE 1.07 04/14/2012 0850   CREATININE 1.13 07/15/2011 1039   CALCIUM 9.6 12/04/2013 1948   CALCIUM 9.8 04/15/2013 1001   CALCIUM 9.7 04/14/2012 0850   GFRNONAA 85 (L) 12/04/2013 1948   GFRNONAA 54 (L) 07/04/2010 0455  GFRNONAA 57 (L) 07/03/2010 0951   GFRAA >90 12/04/2013 1948   GFRAA  07/04/2010 0455    >60        The eGFR has been calculated using the MDRD equation. This calculation has not been validated in all clinical situations. eGFR's persistently <60 mL/min signify possible Chronic Kidney Disease.   GFRAA  07/03/2010 0951    >60        The eGFR has been calculated using the MDRD equation. This calculation has not been validated in all clinical situations. eGFR's persistently <60 mL/min signify possible Chronic Kidney Disease.   CMP     Component Value Date/Time   NA 136 (L) 12/04/2013 1948   K 4.5 12/04/2013 1948   CL 95 (L) 12/04/2013 1948   CO2 27 12/04/2013 1948   GLUCOSE 450 (H) 12/04/2013 1948   BUN 15 12/04/2013 1948   CREATININE 1.06 12/04/2013 1948   CREATININE 1.04 04/15/2013 1001   CALCIUM 9.6 12/04/2013 1948   PROT 6.8 04/15/2013 1001   ALBUMIN 4.0 04/15/2013 1001   AST 31 04/15/2013 1001    ALT 42 04/15/2013 1001   ALKPHOS 84 04/15/2013 1001   BILITOT 0.4 04/15/2013 1001   GFRNONAA 85 (L) 12/04/2013 1948   GFRAA >90 12/04/2013 1948       Component Value Date/Time   WBC 7.1 12/04/2013 1948   WBC 6.5 04/15/2013 1001   WBC 10.8 (H) 07/01/2010 0340   HGB 16.6 12/04/2013 1948   HGB 16.3 04/15/2013 1001   HGB 14.8 07/01/2010 0340   HCT 47.9 12/04/2013 1948   HCT 47.5 04/15/2013 1001   HCT 43.1 07/01/2010 0340   MCV 90.5 12/04/2013 1948   MCV 90.3 04/15/2013 1001   MCV 89.0 07/01/2010 0340    Lipid Panel     Component Value Date/Time   CHOL 243 (H) 04/15/2013 1001   TRIG 1,277 (H) 04/15/2013 1001   HDL 34 (L) 04/15/2013 1001   CHOLHDL 7.1 04/15/2013 1001   VLDL NOT CALC 04/15/2013 1001   LDLCALC NOT CALC 04/15/2013 1001    ABG    Component Value Date/Time   PHART 7.405 07/03/2010 1025   PCO2ART 41.0 07/03/2010 1025   PO2ART 68.0 (L) 07/03/2010 1025   HCO3 24.0 07/03/2010 1027   TCO2 25 07/03/2010 1027   ACIDBASEDEF 2.0 07/03/2010 1027   O2SAT 57.0 07/03/2010 1027     Lab Results  Component Value Date   TSH 1.046 07/02/2010   BNP (last 3 results) No results for input(s): BNP in the last 8760 hours.  ProBNP (last 3 results) No results for input(s): PROBNP in the last 8760 hours.  Cardiac Panel (last 3 results) No results for input(s): CKTOTAL, CKMB, TROPONINI, RELINDX in the last 72 hours.  Iron/TIBC/Ferritin/ %Sat    Component Value Date/Time   FERRITIN 116 07/01/2010 1543     EKG Orders placed or performed in visit on 04/27/16  . EKG 12-Lead     Prior Assessment and Plan Problem List as of 04/27/2016 Reviewed: 05/02/2015  2:35 PM by Jory Sims, NP     Cardiovascular and Mediastinum   Nonischemic cardiomyopathy [facility])   Last Assessment & Plan 03/22/2014 Office Visit Written 03/22/2014  4:45 PM by Lendon Colonel, NP    He is a rare well from a cardiac standpoint.  Heart rate and blood pressure well controlled.  He is medically  compliant.  He is recently begun an exercise program.  We will see him again in 4 months.  If he begins to lose weight.  We may need to adjust his medications to avoid dehydration and hypotension.  He appears to be very motivated.      Hypertension   Last Assessment & Plan 02/14/2014 Office Visit Written 02/14/2014  3:09 PM by Lendon Colonel, NP    Blood pressure is very well controlled despite not taking lisinopril carvedilol or Lasix for 2 weeks. He states, at home. His wife states his blood sugar runs around 833 -582 systolic. He denies any fluid retention.pressure today is 120/82.  I will refill his lisinopril, but drop the dose to 2.5 mg daily for renal protection. I will continue his carvedilol at 12.5 mg 1-1/2 tablet twice a day. I will have him come back in one month for reassessment of blood pressure and heart rate after taking medication. He knows to call or significant dizzy or lightheaded. Also, the patient will have a BMET drawn to evaluate for kidney function.        Endocrine   Diabetes [facility])   Last Assessment & Plan 02/14/2014 Office Visit Written 02/14/2014  3:13 PM by Lendon Colonel, NP    He is not yet found a primary care physician. I will refill his metformin 500 mg daily. We will check a hemoglobin A1c. May need to increase this dose depending on results. For now, he will be placed on his prior dose. He is advised to find a primary care physician. He does have insurance.it is my hope that he will be established soon.  Loss of give him refills on his EpiPen. 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        Other   Hypercholesterolemia   Last Assessment & Plan 03/22/2014 Office Visit Written 03/22/2014  4:45 PM by Lendon Colonel, NP    He has mixed hyperlipidemia.  He is being followed by his primary care physician, Dr. Collene Mares, who is adjusting his diabetic medication.  The patient was then placed on a very strict low cholesterol diabetic  diet.  He has been on Ziac for approximately 2 weeks and is adhering to it.  Dr. Collene Mares will repeat his labs on followup.          Imaging: No results found.

## 2016-04-27 NOTE — Patient Instructions (Signed)
Medication Instructions:  Your physician recommends that you continue on your current medications as directed. Please refer to the Current Medication list given to you today.   Labwork: Your physician recommends that you return for lab work in: ASAP    Testing/Procedures: NONE  Follow-Up: Your physician wants you to follow-up in: 1 YEAR .  You will receive a reminder letter in the mail two months in advance. If you don't receive a letter, please call our office to schedule the follow-up appointment.   Any Other Special Instructions Will Be Listed Below (If Applicable).     If you need a refill on your cardiac medications before your next appointment, please call your pharmacy.   

## 2016-04-27 NOTE — Progress Notes (Signed)
Cardiology Office Note   Date:  04/27/2016   ID:  Bryan Clark, DOB [birth year], MRN [id number]  PCP:  [facility]  Cardiologist: Arlington Calix, NP   Chief Complaint  Patient presents with  . Hypertension  . Cardiomyopathy     History of Present Illness: Bryan Clark is a 46 y.o. male who presents for ongoing assessment and management of hypertension, with history of nonischemic cardiomyopathy. He was last seen in the office on 05/02/2015. At that time he was stable from a cardiovascular standpoint and without complaint. He is being followed by his primary care provider Lenise Herald, PA, for diabetes management.  He comes today without any complaint. 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 The patient denies recurrent chest pain shortness of breath and dizziness, or fatigue. 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 He has not had any exertional symptoms. He is medically compliant. He does not see his primary care physician until May 2018.  Past Medical History:  Diagnosis Date  . Acute systolic heart failure (HCC)    exacerbation  . Cardiomyopathy    nonischemic ejection fraction 20% by cardiac cath  . Hypertension     Past Surgical History:  Procedure Laterality Date  . bilaterial pleural effusion    . BLADDER SURGERY     trauma as a child during martial arts training     Current Outpatient Prescriptions  Medication Sig Dispense Refill  . aspirin EC 81 MG tablet Take 81 mg by mouth daily.    Marland Kitchen atorvastatin (LIPITOR) 40 MG tablet Take 1 tablet (40 mg total) by mouth daily. 30 tablet 3  . carvedilol (COREG) 12.5 MG tablet TAKE 1 AND 1/2 TABLETS BY MOUTH TWICE DAILY WITH MEALS. 90 tablet 3  . Empagliflozin-Linagliptin 25-5 MG TABS Take 25 mg by mouth daily.    Marland Kitchen EPINEPHrine 0.3 mg/0.3 mL IJ SOAJ injection Inject 0.3 mLs (0.3 mg total) into the muscle  once. 1 Device 0  . furosemide (LASIX) 20 MG tablet Take 1 tablet (20 mg total) by mouth daily. 30 tablet 0  . lisinopril (PRINIVIL,ZESTRIL) 2.5 MG tablet Take 1 tablet (2.5 mg total) by mouth daily. 90 tablet 3  . metFORMIN (GLUCOPHAGE) 500 MG tablet Take 1 tablet (500 mg total) by mouth daily. (Patient taking differently: Take 500 mg by mouth 2 (two) times daily with a meal. ) 30 tablet 0  . Multiple Vitamin (MULTIVITAMIN WITH MINERALS) TABS tablet Take 1 tablet by mouth daily.    . Omega-3 Fatty Acids (FISH OIL) 500 MG CAPS Take 500 mg by mouth.     No current facility-administered medications for this visit.     Allergies:   Bee venom    Social History:  The patient  reports that he has never smoked. He has never used smokeless tobacco. He reports that he does not drink alcohol or use drugs.   Family History:  The patient's family history includes Heart attack in his mother; Heart disease in his sister; Hypertension in his mother.    ROS: All other systems are reviewed and negative. Unless otherwise mentioned in H&P    PHYSICAL EXAM: VS:  BP 124/80   Pulse 85   Ht 5\' 9"  (1.753 m)   Wt 217 lb (98.4 kg)   SpO2 96%   BMI 32.05 kg/m  , BMI Body mass index is 32.05  kg/m. GEN: Well nourished, well developed, in no acute distress  HEENT: normal  Neck: no JVD, carotid bruits, or masses Cardiac: RRR; no murmurs, rubs, or gallops,no edema  Respiratory:  clear to auscultation bilaterally, normal work of breathing GI: soft, nontender, nondistended, + BS MS: no deformity or atrophy  Skin: warm and dry, no rash Neuro:  Strength and sensation are intact Psych: euthymic mood, full affect   EKG:  The ekg ordered today demonstrates normal sinus rhythm with incomplete left bundle branch block. Heart rate 75 bpm   Recent Labs: No results found for requested labs within last 8760 hours.    Lipid Panel    Component Value Date/Time   CHOL 243 (H) 04/15/2013 1001   TRIG 1,277 (H)  04/15/2013 1001   HDL 34 (L) 04/15/2013 1001   CHOLHDL 7.1 04/15/2013 1001   VLDL NOT CALC 04/15/2013 1001   LDLCALC NOT CALC 04/15/2013 1001      Wt Readings from Last 3 Encounters:  04/27/16 217 lb (98.4 kg)  05/02/15 219 lb (99.3 kg)  03/22/14 222 lb (100.7 kg)      Other studies Reviewed: Echocardiogram 2013-05-18 Left ventricle: The cavity size was normal. Wall thickness was normal. Systolic function was mildly reduced. The estimated ejection fraction was in the range of 45% to 50%. Wall motion was normal; there were no regional wall motion abnormalities. Doppler parameters are consistent with abnormal left ventricular relaxation (grade 1 diastolic dysfunction). - Aortic root: The aortic root was mildly ectatic. - Mitral valve: Trivial regurgitation. - Right ventricle: Systolic function was mildly reduced. - Tricuspid valve: Physiologic regurgitation. - Pulmonary arteries: Systolic pressure could not be accurately estimated. - Inferior vena cava: Poorly visualized. Unable to estimate CVP. - Pericardium, extracardiac: There was no pericardial effusion.  ASSESSMENT AND PLAN:  1.  Hypertension: Blood pressure is well controlled currently. We'll continue current medication regimen, refills of this provided on all cardiac meds. We'll have a BMET and a CBC completed as he is not going to see his primary care physician until May 2018 to update laboratory evaluations.  2. Hypercholesterolemia: Check fasting lipids and LFTs. He is continue fish oil and atorvastatin.  3. Diabetes: Hemoglobin A1c will be ordered with all results to primary care. Management per primary care.     Current medicines are reviewed at length with the patient today.    Labs/ tests ordered today include:   Orders Placed This Encounter  Procedures  . Lipid Profile  . Hepatic function panel  . HgB A1c  . Basic Metabolic Panel (BMET)  . CBC with Differential  . EKG 12-Lead      Disposition:   FU with one year  Signed, Joni Reining, NP  04/27/2016 5:03 PM    [facility] [address] [place], [place] [address] Phone: [phone]; Fax: [phone]

## 2016-05-01 DIAGNOSIS — I10 Essential (primary) hypertension: Secondary | ICD-10-CM | POA: Diagnosis not present

## 2016-05-01 DIAGNOSIS — E785 Hyperlipidemia, unspecified: Secondary | ICD-10-CM | POA: Diagnosis not present

## 2016-05-01 LAB — CBC WITH DIFFERENTIAL/PLATELET
BASOS ABS: 0 {cells}/uL (ref 0–200)
BASOS PCT: 0 %
EOS ABS: 219 {cells}/uL (ref 15–500)
Eosinophils Relative: 3 %
HCT: 51 % — ABNORMAL HIGH (ref 38.5–50.0)
HEMOGLOBIN: 17.2 g/dL — AB (ref 13.2–17.1)
Lymphocytes Relative: 34 %
Lymphs Abs: 2482 cells/uL (ref 850–3900)
MCH: 31.2 pg (ref 27.0–33.0)
MCHC: 33.7 g/dL (ref 32.0–36.0)
MCV: 92.4 fL (ref 80.0–100.0)
MPV: 10.9 fL (ref 7.5–12.5)
Monocytes Absolute: 657 cells/uL (ref 200–950)
Monocytes Relative: 9 %
NEUTROS ABS: 3942 {cells}/uL (ref 1500–7800)
Neutrophils Relative %: 54 %
Platelets: 120 10*3/uL — ABNORMAL LOW (ref 140–400)
RBC: 5.52 MIL/uL (ref 4.20–5.80)
RDW: 13.5 % (ref 11.0–15.0)
WBC: 7.3 10*3/uL (ref 3.8–10.8)

## 2016-05-02 LAB — HEPATIC FUNCTION PANEL
ALT: 31 U/L (ref 9–46)
AST: 21 U/L (ref 10–40)
Albumin: 4.2 g/dL (ref 3.6–5.1)
Alkaline Phosphatase: 60 U/L (ref 40–115)
BILIRUBIN DIRECT: 0.1 mg/dL (ref <=0.2)
BILIRUBIN INDIRECT: 0.3 mg/dL (ref 0.2–1.2)
Total Bilirubin: 0.4 mg/dL (ref 0.2–1.2)
Total Protein: 6.8 g/dL (ref 6.1–8.1)

## 2016-05-02 LAB — BASIC METABOLIC PANEL
BUN: 22 mg/dL (ref 7–25)
CHLORIDE: 102 mmol/L (ref 98–110)
CO2: 25 mmol/L (ref 20–31)
Calcium: 9.8 mg/dL (ref 8.6–10.3)
Creat: 1.17 mg/dL (ref 0.60–1.35)
Glucose, Bld: 186 mg/dL — ABNORMAL HIGH (ref 65–99)
Potassium: 4.1 mmol/L (ref 3.5–5.3)
SODIUM: 139 mmol/L (ref 135–146)

## 2016-05-02 LAB — LIPID PANEL
CHOLESTEROL: 165 mg/dL (ref <200)
HDL: 32 mg/dL — ABNORMAL LOW (ref >40)
Total CHOL/HDL Ratio: 5.2 Ratio — ABNORMAL HIGH (ref <5.0)
Triglycerides: 632 mg/dL — ABNORMAL HIGH (ref <150)

## 2016-05-02 LAB — HEMOGLOBIN A1C
Hgb A1c MFr Bld: 8.2 % — ABNORMAL HIGH (ref <5.7)
MEAN PLASMA GLUCOSE: 189 mg/dL

## 2016-05-04 ENCOUNTER — Telehealth: Payer: Self-pay | Admitting: *Deleted

## 2016-05-04 NOTE — Telephone Encounter (Signed)
-----   Message from Jodelle Gross, NP sent at 05/04/2016  6:35 AM EST ----- Significant elevation in triglycerides. Will need to follow up with PCP for ongoing diabetes management Send copy to PCP. No changes in regimen.

## 2016-05-04 NOTE — Telephone Encounter (Signed)
Called patient with test results. No answer. Left message to call back.  

## 2016-08-31 ENCOUNTER — Ambulatory Visit (HOSPITAL_COMMUNITY)
Admission: RE | Admit: 2016-08-31 | Discharge: 2016-08-31 | Disposition: A | Discharge status: Home / Self Care | Payer: BLUE CROSS/BLUE SHIELD | Source: Ambulatory Visit | Attending: Registered Nurse | Admitting: Registered Nurse

## 2016-08-31 ENCOUNTER — Other Ambulatory Visit (HOSPITAL_COMMUNITY): Payer: Self-pay | Admitting: Registered Nurse

## 2016-08-31 DIAGNOSIS — I429 Cardiomyopathy, unspecified: Secondary | ICD-10-CM | POA: Diagnosis not present

## 2016-08-31 DIAGNOSIS — R079 Chest pain, unspecified: Secondary | ICD-10-CM | POA: Diagnosis not present

## 2016-08-31 DIAGNOSIS — E782 Mixed hyperlipidemia: Secondary | ICD-10-CM | POA: Diagnosis not present

## 2016-08-31 DIAGNOSIS — E119 Type 2 diabetes mellitus without complications: Secondary | ICD-10-CM | POA: Diagnosis not present

## 2016-08-31 DIAGNOSIS — X58XXXA Exposure to other specified factors, initial encounter: Secondary | ICD-10-CM | POA: Diagnosis not present

## 2016-08-31 DIAGNOSIS — R0781 Pleurodynia: Secondary | ICD-10-CM | POA: Diagnosis not present

## 2016-08-31 DIAGNOSIS — S299XXA Unspecified injury of thorax, initial encounter: Secondary | ICD-10-CM | POA: Insufficient documentation

## 2016-08-31 DIAGNOSIS — Z6833 Body mass index (BMI) 33.0-33.9, adult: Secondary | ICD-10-CM | POA: Insufficient documentation

## 2016-08-31 DIAGNOSIS — I10 Essential (primary) hypertension: Secondary | ICD-10-CM | POA: Diagnosis not present

## 2016-08-31 DIAGNOSIS — E6609 Other obesity due to excess calories: Secondary | ICD-10-CM | POA: Diagnosis not present

## 2016-09-28 ENCOUNTER — Other Ambulatory Visit: Payer: Self-pay | Admitting: Adult Health

## 2017-02-07 ENCOUNTER — Emergency Department (HOSPITAL_COMMUNITY)
Admission: EM | Admit: 2017-02-07 | Discharge: 2017-02-07 | Disposition: A | Discharge status: Home / Self Care | Payer: BLUE CROSS/BLUE SHIELD | Attending: Emergency Medicine | Admitting: Emergency Medicine

## 2017-02-07 ENCOUNTER — Emergency Department (HOSPITAL_COMMUNITY): Payer: BLUE CROSS/BLUE SHIELD

## 2017-02-07 ENCOUNTER — Encounter (HOSPITAL_COMMUNITY): Payer: Self-pay | Admitting: Emergency Medicine

## 2017-02-07 ENCOUNTER — Other Ambulatory Visit: Payer: Self-pay

## 2017-02-07 DIAGNOSIS — Z7982 Long term (current) use of aspirin: Secondary | ICD-10-CM | POA: Insufficient documentation

## 2017-02-07 DIAGNOSIS — I110 Hypertensive heart disease with heart failure: Secondary | ICD-10-CM | POA: Insufficient documentation

## 2017-02-07 DIAGNOSIS — Z7984 Long term (current) use of oral hypoglycemic drugs: Secondary | ICD-10-CM | POA: Insufficient documentation

## 2017-02-07 DIAGNOSIS — R0789 Other chest pain: Secondary | ICD-10-CM | POA: Diagnosis not present

## 2017-02-07 DIAGNOSIS — E119 Type 2 diabetes mellitus without complications: Secondary | ICD-10-CM | POA: Diagnosis not present

## 2017-02-07 DIAGNOSIS — R079 Chest pain, unspecified: Secondary | ICD-10-CM | POA: Diagnosis not present

## 2017-02-07 DIAGNOSIS — I5021 Acute systolic (congestive) heart failure: Secondary | ICD-10-CM | POA: Insufficient documentation

## 2017-02-07 HISTORY — DX: Pure hypercholesterolemia, unspecified: E78.00

## 2017-02-07 LAB — CBC
HEMATOCRIT: 51 % (ref 39.0–52.0)
Hemoglobin: 16.8 g/dL (ref 13.0–17.0)
MCH: 30.9 pg (ref 26.0–34.0)
MCHC: 32.9 g/dL (ref 30.0–36.0)
MCV: 93.8 fL (ref 78.0–100.0)
Platelets: 118 10*3/uL — ABNORMAL LOW (ref 150–400)
RBC: 5.44 MIL/uL (ref 4.22–5.81)
RDW: 13.1 % (ref 11.5–15.5)
WBC: 8.5 10*3/uL (ref 4.0–10.5)

## 2017-02-07 LAB — I-STAT TROPONIN, ED: Troponin i, poc: 0 ng/mL (ref 0.00–0.08)

## 2017-02-07 LAB — BASIC METABOLIC PANEL
ANION GAP: 8 (ref 5–15)
BUN: 19 mg/dL (ref 6–20)
CO2: 27 mmol/L (ref 22–32)
Calcium: 9.4 mg/dL (ref 8.9–10.3)
Chloride: 101 mmol/L (ref 101–111)
Creatinine, Ser: 0.98 mg/dL (ref 0.61–1.24)
GFR calc Af Amer: >60 mL/min (ref >60)
GFR calc non Af Amer: >60 mL/min (ref >60)
GLUCOSE: 183 mg/dL — AB (ref 65–99)
POTASSIUM: 4 mmol/L (ref 3.5–5.1)
Sodium: 136 mmol/L (ref 135–145)

## 2017-02-07 MED ORDER — ACETAMINOPHEN 500 MG PO TABS
1000.0000 mg | ORAL_TABLET | Freq: Once | ORAL | Status: AC
  Administered 2017-02-07: 1000 mg via ORAL
  Filled 2017-02-07: qty 2

## 2017-02-07 MED ORDER — IBUPROFEN 400 MG PO TABS
400.0000 mg | ORAL_TABLET | Freq: Once | ORAL | Status: AC
  Administered 2017-02-07: 400 mg via ORAL
  Filled 2017-02-07: qty 1

## 2017-02-07 NOTE — ED Triage Notes (Signed)
Pt c/o left sided cp radiating to center of chest since last night. 1 episode n/v.

## 2017-02-07 NOTE — Discharge Instructions (Signed)
It was our pleasure to provide your ER care today - we hope that you feel better.  Take motrin or aleve as need for pain.  Follow up with your doctor/cardiologist in the coming week.  Return to ER if worse, new symptoms, high fevers, trouble breathing, persistent/recurrent chest pain, other concern.

## 2017-02-07 NOTE — ED Provider Notes (Signed)
[facility] Provider Note   CSN: [id number] Arrival date & time: 02/07/17  1025     History   Chief Complaint Chief Complaint  Patient presents with  . Chest Pain    HPI Bryan Clark is a 46 y.o. male.  Patient c/o mid chest pain since last evening. Mid chest/midline. Occurs at rest. No relation to activity or exertion. Non radiating. Worse w certain positional changes and palpation. No associated sob, nv or diaphoresis. No pleuritic pain. No leg pain or swelling. No recent surgery, immobility, trauma or travel. No fam hx premature cad. Pt denies chest wall injury or strain. No heartburn. Pt with history non-ischemic cardiomyopathy several years ago - cardiac cath with normal coronaries, and last echo with normal lv fxn.    The history is provided by the patient.  Chest Pain   Pertinent negatives include no abdominal pain, no back pain, no cough, no fever, no headaches, no palpitations, no shortness of breath and no vomiting.    Past Medical History:  Diagnosis Date  . Acute systolic heart failure (HCC)    exacerbation  . Cardiomyopathy    nonischemic ejection fraction 20% by cardiac cath  . High cholesterol   . Hypertension     Patient Active Problem List   Diagnosis Date Noted  . Diabetes (HCC) 04/14/2013  . Nonischemic cardiomyopathy (HCC) 07/11/2010  . Hypertension 07/11/2010  . Hypercholesterolemia 07/11/2010    Past Surgical History:  Procedure Laterality Date  . bilaterial pleural effusion    . BLADDER SURGERY     trauma as a child during martial arts training       Home Medications    Prior to Admission medications   Medication Sig Start Date End Date Taking? Authorizing Provider  aspirin EC 81 MG tablet Take 81 mg by mouth daily.    [provider]  atorvastatin (LIPITOR) 40 MG tablet Take 1 tablet (40 mg total) by mouth daily. 04/27/16   Jodelle GrossLawrence, Kathryn M, NP  carvedilol (COREG) 12.5 MG tablet TAKE 1 AND 1/2  TABLETS BY MOUTH TWICE DAILY WITH MEALS. 09/28/16   Jodelle GrossLawrence, Kathryn M, NP  Empagliflozin-Linagliptin 25-5 MG TABS Take 25 mg by mouth daily.    [provider]  EPINEPHrine 0.3 mg/0.3 mL IJ SOAJ injection Inject 0.3 mLs (0.3 mg total) into the muscle once. 02/14/14   Jodelle GrossLawrence, Kathryn M, NP  furosemide (LASIX) 20 MG tablet Take 1 tablet (20 mg total) by mouth daily. 04/27/16   Jodelle GrossLawrence, Kathryn M, NP  lisinopril (PRINIVIL,ZESTRIL) 2.5 MG tablet Take 1 tablet (2.5 mg total) by mouth daily. 04/27/16   Jodelle GrossLawrence, Kathryn M, NP  metFORMIN (GLUCOPHAGE) 500 MG tablet Take 1 tablet (500 mg total) by mouth daily. Patient taking differently: Take 500 mg by mouth 2 (two) times daily with a meal.  02/14/14   Jodelle GrossLawrence, Kathryn M, NP  Multiple Vitamin (MULTIVITAMIN WITH MINERALS) TABS tablet Take 1 tablet by mouth daily.    [provider]  Omega-3 Fatty Acids (FISH OIL) 500 MG CAPS Take 500 mg by mouth.    [provider]    Family History Family History  Problem Relation Age of Onset  . Heart attack Mother   . Hypertension Mother   . Heart disease Sister     Social History Social History   Tobacco Use  . Smoking status: Never Smoker  . Smokeless tobacco: Never Used  Substance Use Topics  . Alcohol use: No  . Drug use: No  Allergies   Bee venom   Review of Systems Review of Systems  Constitutional: Negative for fever.  HENT: Negative for sore throat.   Eyes: Negative for redness.  Respiratory: Negative for cough and shortness of breath.   Cardiovascular: Positive for chest pain. Negative for palpitations and leg swelling.  Gastrointestinal: Negative for abdominal pain and vomiting.  Genitourinary: Negative for flank pain.  Musculoskeletal: Negative for back pain and neck pain.  Skin: Negative for rash.  Neurological: Negative for headaches.  Hematological: Does not bruise/bleed easily.  Psychiatric/Behavioral: Negative for confusion.     Physical  Exam Updated Vital Signs BP (!) 137/98   Pulse 79   Temp 97.9 F (36.6 C)   Resp 18   Ht 1.753 m (5\' 9" )   Wt 95.3 kg (210 lb)   SpO2 96%   BMI 31.01 kg/m   Physical Exam  Constitutional: He appears well-developed and well-nourished. No distress.  HENT:  Mouth/Throat: Oropharynx is clear and moist.  Eyes: Conjunctivae are normal.  Neck: Neck supple. No tracheal deviation present. No thyromegaly present.  Cardiovascular: Normal rate, regular rhythm, normal heart sounds and intact distal pulses. Exam reveals no gallop and no friction rub.  No murmur heard. Pulmonary/Chest: Effort normal and breath sounds normal. No accessory muscle usage. No respiratory distress. He exhibits tenderness.  Abdominal: Soft. Bowel sounds are normal. He exhibits no distension. There is no tenderness.  Musculoskeletal: He exhibits no edema or tenderness.  Neurological: He is alert.  Skin: Skin is warm and dry. He is not diaphoretic.  Psychiatric: He has a normal mood and affect.  Nursing note and vitals reviewed.    ED Treatments / Results  Labs (all labs ordered are listed, but only abnormal results are displayed) Results for orders placed or performed during the hospital encounter of 02/07/17  CBC  Result Value Ref Range   WBC 8.5 4.0 - 10.5 K/uL   RBC 5.44 4.22 - 5.81 MIL/uL   Hemoglobin 16.8 13.0 - 17.0 g/dL   HCT 16.1 09.6 - 04.5 %   MCV 93.8 78.0 - 100.0 fL   MCH 30.9 26.0 - 34.0 pg   MCHC 32.9 30.0 - 36.0 g/dL   RDW 40.9 81.1 - 91.4 %   Platelets 118 (L) 150 - 400 K/uL  Basic metabolic panel  Result Value Ref Range   Sodium 136 135 - 145 mmol/L   Potassium 4.0 3.5 - 5.1 mmol/L   Chloride 101 101 - 111 mmol/L   CO2 27 22 - 32 mmol/L   Glucose, Bld 183 (H) 65 - 99 mg/dL   BUN 19 6 - 20 mg/dL   Creatinine, Ser 7.82 0.61 - 1.24 mg/dL   Calcium 9.4 8.9 - 95.6 mg/dL   GFR calc non Af Amer >60 >60 mL/min   GFR calc Af Amer >60 >60 mL/min   Anion gap 8 5 - 15  I-stat troponin, ED   Result Value Ref Range   Troponin i, poc 0.00 0.00 - 0.08 ng/mL   Comment 3           Dg Chest 2 View  Result Date: 02/07/2017 CLINICAL DATA:  Chest pain on left EXAM: CHEST  2 VIEW COMPARISON:  08/31/2016 FINDINGS: Heart and mediastinal contours are within normal limits. No focal opacities or effusions. No acute bony abnormality. IMPRESSION: No active cardiopulmonary disease. Electronically Signed   By: Charlett Nose M.D.   On: 02/07/2017 11:47    EKG  EKG Interpretation  Date/Time:  Sunday February 07 2017 10:33:18 EST Ventricular Rate:  76 PR Interval:    QRS Duration: 110 QT Interval:  392 QTC Calculation: 441 R Axis:   23 Text Interpretation:  Sinus rhythm Normal ECG Confirmed by Cathren Laine ([id number]) on 02/07/2017 12:30:23 PM       Radiology Dg Chest 2 View  Result Date: 02/07/2017 CLINICAL DATA:  Chest pain on left EXAM: CHEST  2 VIEW COMPARISON:  08/31/2016 FINDINGS: Heart and mediastinal contours are within normal limits. No focal opacities or effusions. No acute bony abnormality. IMPRESSION: No active cardiopulmonary disease. Electronically Signed   By: Charlett Nose M.D.   On: 02/07/2017 11:47    Procedures Procedures (including critical care time)  Medications Ordered in ED Medications  ibuprofen (ADVIL,MOTRIN) tablet 400 mg (not administered)  acetaminophen (TYLENOL) tablet 1,000 mg (not administered)     Initial Impression / Assessment and Plan / ED Course  I have reviewed the triage vital signs and the nursing notes.  Pertinent labs & imaging results that were available during my care of the patient were reviewed by me and considered in my medical decision making (see chart for details).  Iv ns. Ecg. Monitor. Labs. Cxr.  Reviewed nursing notes and prior charts for additional history.   Prior cardiac cath with normal coronaries.   Motrin po.  After symptoms for past 16 hours, trop is 0. cxr neg.  Symptoms appear most c/w musculoskeletal/chest wall  pain.   Pt currently appears stable for d/c.     Final Clinical Impressions(s) / ED Diagnoses   Final diagnoses:  None    ED Discharge Orders    None       Cathren Laine, MD 02/07/17 1231

## 2017-03-24 DIAGNOSIS — E1165 Type 2 diabetes mellitus with hyperglycemia: Secondary | ICD-10-CM | POA: Diagnosis not present

## 2017-03-24 DIAGNOSIS — E782 Mixed hyperlipidemia: Secondary | ICD-10-CM | POA: Diagnosis not present

## 2017-03-24 DIAGNOSIS — Z0001 Encounter for general adult medical examination with abnormal findings: Secondary | ICD-10-CM | POA: Diagnosis not present

## 2017-03-24 DIAGNOSIS — E6609 Other obesity due to excess calories: Secondary | ICD-10-CM | POA: Diagnosis not present

## 2017-03-24 DIAGNOSIS — B351 Tinea unguium: Secondary | ICD-10-CM | POA: Diagnosis not present

## 2017-03-24 DIAGNOSIS — Z6834 Body mass index (BMI) 34.0-34.9, adult: Secondary | ICD-10-CM | POA: Diagnosis not present

## 2017-03-24 DIAGNOSIS — I10 Essential (primary) hypertension: Secondary | ICD-10-CM | POA: Diagnosis not present

## 2017-03-24 DIAGNOSIS — Z0000 Encounter for general adult medical examination without abnormal findings: Secondary | ICD-10-CM | POA: Diagnosis not present

## 2017-03-24 DIAGNOSIS — Z1389 Encounter for screening for other disorder: Secondary | ICD-10-CM | POA: Diagnosis not present

## 2017-06-23 DIAGNOSIS — E6609 Other obesity due to excess calories: Secondary | ICD-10-CM | POA: Diagnosis not present

## 2017-06-23 DIAGNOSIS — E119 Type 2 diabetes mellitus without complications: Secondary | ICD-10-CM | POA: Diagnosis not present

## 2017-06-23 DIAGNOSIS — Z1389 Encounter for screening for other disorder: Secondary | ICD-10-CM | POA: Diagnosis not present

## 2017-06-23 DIAGNOSIS — Z6833 Body mass index (BMI) 33.0-33.9, adult: Secondary | ICD-10-CM | POA: Diagnosis not present

## 2017-06-23 DIAGNOSIS — B351 Tinea unguium: Secondary | ICD-10-CM | POA: Diagnosis not present

## 2017-09-27 ENCOUNTER — Other Ambulatory Visit: Payer: Self-pay

## 2017-09-27 MED ORDER — CARVEDILOL 12.5 MG PO TABS: ORAL_TABLET | ORAL | 0 refills | Status: DC

## 2017-09-27 NOTE — Telephone Encounter (Signed)
Was due for f/u apt 03/2017, has not been seen, refilled 30 day supply of coreg, have messaged pharmacy to ask pt to call office for apt

## 2017-10-22 DIAGNOSIS — R0781 Pleurodynia: Secondary | ICD-10-CM | POA: Diagnosis not present

## 2017-10-22 DIAGNOSIS — R1011 Right upper quadrant pain: Secondary | ICD-10-CM | POA: Diagnosis not present

## 2017-10-22 DIAGNOSIS — R1013 Epigastric pain: Secondary | ICD-10-CM | POA: Diagnosis not present

## 2017-10-22 DIAGNOSIS — M6208 Separation of muscle (nontraumatic), other site: Secondary | ICD-10-CM | POA: Diagnosis not present

## 2017-10-25 ENCOUNTER — Other Ambulatory Visit: Payer: Self-pay | Admitting: Pediatrics

## 2017-10-25 DIAGNOSIS — R1011 Right upper quadrant pain: Secondary | ICD-10-CM

## 2017-10-26 ENCOUNTER — Ambulatory Visit (HOSPITAL_COMMUNITY)
Admission: RE | Admit: 2017-10-26 | Discharge: 2017-10-26 | Disposition: A | Discharge status: Home / Self Care | Payer: BLUE CROSS/BLUE SHIELD | Source: Ambulatory Visit | Attending: Pediatrics | Admitting: Pediatrics

## 2017-10-26 DIAGNOSIS — K7689 Other specified diseases of liver: Secondary | ICD-10-CM | POA: Diagnosis not present

## 2017-10-26 DIAGNOSIS — R932 Abnormal findings on diagnostic imaging of liver and biliary tract: Secondary | ICD-10-CM | POA: Diagnosis not present

## 2017-10-26 DIAGNOSIS — R1011 Right upper quadrant pain: Secondary | ICD-10-CM | POA: Insufficient documentation

## 2017-12-22 DIAGNOSIS — Z6832 Body mass index (BMI) 32.0-32.9, adult: Secondary | ICD-10-CM | POA: Diagnosis not present

## 2017-12-22 DIAGNOSIS — E6609 Other obesity due to excess calories: Secondary | ICD-10-CM | POA: Diagnosis not present

## 2017-12-22 DIAGNOSIS — I10 Essential (primary) hypertension: Secondary | ICD-10-CM | POA: Diagnosis not present

## 2017-12-22 DIAGNOSIS — Z1389 Encounter for screening for other disorder: Secondary | ICD-10-CM | POA: Diagnosis not present

## 2017-12-22 DIAGNOSIS — E119 Type 2 diabetes mellitus without complications: Secondary | ICD-10-CM | POA: Diagnosis not present

## 2018-01-28 ENCOUNTER — Emergency Department: Payer: No Typology Code available for payment source

## 2018-01-28 ENCOUNTER — Ambulatory Visit: Payer: Self-pay | Admitting: Adult Health

## 2018-01-28 ENCOUNTER — Encounter: Payer: Self-pay | Admitting: Emergency Medicine

## 2018-01-28 ENCOUNTER — Emergency Department
Admission: EM | Admit: 2018-01-28 | Discharge: 2018-01-28 | Disposition: A | Discharge status: Home / Self Care | Payer: No Typology Code available for payment source | Attending: Student in an Organized Health Care Education/Training Program | Admitting: Student in an Organized Health Care Education/Training Program

## 2018-01-28 DIAGNOSIS — S39012A Strain of muscle, fascia and tendon of lower back, initial encounter: Secondary | ICD-10-CM | POA: Insufficient documentation

## 2018-01-28 DIAGNOSIS — M5136 Other intervertebral disc degeneration, lumbar region: Secondary | ICD-10-CM | POA: Diagnosis not present

## 2018-01-28 DIAGNOSIS — Y998 Other external cause status: Secondary | ICD-10-CM | POA: Diagnosis not present

## 2018-01-28 DIAGNOSIS — Z7982 Long term (current) use of aspirin: Secondary | ICD-10-CM | POA: Insufficient documentation

## 2018-01-28 DIAGNOSIS — Y9389 Activity, other specified: Secondary | ICD-10-CM | POA: Diagnosis not present

## 2018-01-28 DIAGNOSIS — S199XXA Unspecified injury of neck, initial encounter: Secondary | ICD-10-CM | POA: Diagnosis present

## 2018-01-28 DIAGNOSIS — E119 Type 2 diabetes mellitus without complications: Secondary | ICD-10-CM | POA: Insufficient documentation

## 2018-01-28 DIAGNOSIS — Y92410 Unspecified street and highway as the place of occurrence of the external cause: Secondary | ICD-10-CM | POA: Diagnosis not present

## 2018-01-28 DIAGNOSIS — Z79899 Other long term (current) drug therapy: Secondary | ICD-10-CM | POA: Diagnosis not present

## 2018-01-28 DIAGNOSIS — Y990 Civilian activity done for income or pay: Secondary | ICD-10-CM

## 2018-01-28 DIAGNOSIS — Z7984 Long term (current) use of oral hypoglycemic drugs: Secondary | ICD-10-CM | POA: Insufficient documentation

## 2018-01-28 DIAGNOSIS — S161XXA Strain of muscle, fascia and tendon at neck level, initial encounter: Secondary | ICD-10-CM | POA: Diagnosis not present

## 2018-01-28 DIAGNOSIS — I110 Hypertensive heart disease with heart failure: Secondary | ICD-10-CM | POA: Diagnosis not present

## 2018-01-28 DIAGNOSIS — M5030 Other cervical disc degeneration, unspecified cervical region: Secondary | ICD-10-CM | POA: Insufficient documentation

## 2018-01-28 DIAGNOSIS — I5020 Unspecified systolic (congestive) heart failure: Secondary | ICD-10-CM | POA: Diagnosis not present

## 2018-01-28 MED ORDER — NAPROXEN 500 MG PO TABS: 500.0000 mg | ORAL_TABLET | Freq: Two times a day (BID) | ORAL | 0 refills | Status: DC

## 2018-01-28 MED ORDER — METHOCARBAMOL 500 MG PO TABS: 500.0000 mg | ORAL_TABLET | Freq: Every evening | ORAL | 0 refills | Status: DC | PRN

## 2018-01-28 NOTE — Progress Notes (Signed)
Office visit  documented on paper chart per protocol initiated by Wanda Smith RN occupational  supervisor and in agreement with collaborating physician Richard Gilbert MD.   See [employer] [facility] and or [employer] workers compensation department for copy.   
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
## 2018-01-28 NOTE — Discharge Instructions (Signed)
Follow-up with your provider at the employee clinic for [facility] on Monday for work determination.  Begin taking naproxen 500 mg twice daily with food.  Moist heat or ice to your neck and lower back as needed for discomfort.  Methocarbamol 500 mg 1 at bedtime if needed for muscle spasms.  Do not drive or operate machinery while taking this medication.  This medication could also cause drowsiness and increase your risk for falling.

## 2018-01-28 NOTE — ED Notes (Signed)
FIRST NURSE NOTE:  Pt works at [employer], reports [employer], pt here with [employer]. Ambulatory in lobby.
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
## 2018-03-31 DIAGNOSIS — Z6831 Body mass index (BMI) 31.0-31.9, adult: Secondary | ICD-10-CM | POA: Diagnosis not present

## 2018-03-31 DIAGNOSIS — J22 Unspecified acute lower respiratory infection: Secondary | ICD-10-CM | POA: Diagnosis not present

## 2018-03-31 DIAGNOSIS — J209 Acute bronchitis, unspecified: Secondary | ICD-10-CM | POA: Diagnosis not present

## 2018-03-31 DIAGNOSIS — E6609 Other obesity due to excess calories: Secondary | ICD-10-CM | POA: Diagnosis not present

## 2018-08-12 DIAGNOSIS — E119 Type 2 diabetes mellitus without complications: Secondary | ICD-10-CM | POA: Diagnosis not present

## 2018-08-12 DIAGNOSIS — I10 Essential (primary) hypertension: Secondary | ICD-10-CM | POA: Diagnosis not present

## 2018-08-12 DIAGNOSIS — E6609 Other obesity due to excess calories: Secondary | ICD-10-CM | POA: Diagnosis not present

## 2018-08-12 DIAGNOSIS — E7849 Other hyperlipidemia: Secondary | ICD-10-CM | POA: Diagnosis not present

## 2018-08-12 DIAGNOSIS — Z1389 Encounter for screening for other disorder: Secondary | ICD-10-CM | POA: Diagnosis not present

## 2018-08-12 DIAGNOSIS — Z6833 Body mass index (BMI) 33.0-33.9, adult: Secondary | ICD-10-CM | POA: Diagnosis not present

## 2018-10-17 DIAGNOSIS — Z20828 Contact with and (suspected) exposure to other viral communicable diseases: Secondary | ICD-10-CM | POA: Diagnosis not present

## 2018-11-10 ENCOUNTER — Other Ambulatory Visit: Payer: Self-pay

## 2018-11-10 ENCOUNTER — Ambulatory Visit: Payer: Self-pay | Admitting: Nurse Practitioner

## 2018-11-10 DIAGNOSIS — H10029 Other mucopurulent conjunctivitis, unspecified eye: Secondary | ICD-10-CM

## 2018-11-10 MED ORDER — CIPROFLOXACIN HCL 0.3 % OP SOLN: 2.0000 [drp] | OPHTHALMIC | 0 refills | Status: AC

## 2018-11-15 DIAGNOSIS — Z6832 Body mass index (BMI) 32.0-32.9, adult: Secondary | ICD-10-CM | POA: Diagnosis not present

## 2018-11-15 DIAGNOSIS — E119 Type 2 diabetes mellitus without complications: Secondary | ICD-10-CM | POA: Diagnosis not present

## 2018-11-15 DIAGNOSIS — E6609 Other obesity due to excess calories: Secondary | ICD-10-CM | POA: Diagnosis not present

## 2018-11-15 DIAGNOSIS — I10 Essential (primary) hypertension: Secondary | ICD-10-CM | POA: Diagnosis not present

## 2019-02-06 DIAGNOSIS — U071 COVID-19: Secondary | ICD-10-CM | POA: Diagnosis not present

## 2019-02-07 ENCOUNTER — Other Ambulatory Visit: Payer: Self-pay

## 2019-02-07 DIAGNOSIS — Z20822 Contact with and (suspected) exposure to covid-19: Secondary | ICD-10-CM

## 2019-02-09 LAB — NOVEL CORONAVIRUS, NAA: SARS-CoV-2, NAA: DETECTED — AB

## 2019-03-20 DIAGNOSIS — E1165 Type 2 diabetes mellitus with hyperglycemia: Secondary | ICD-10-CM | POA: Diagnosis not present

## 2019-03-20 DIAGNOSIS — E6609 Other obesity due to excess calories: Secondary | ICD-10-CM | POA: Diagnosis not present

## 2019-03-20 DIAGNOSIS — Z6832 Body mass index (BMI) 32.0-32.9, adult: Secondary | ICD-10-CM | POA: Diagnosis not present

## 2019-03-20 DIAGNOSIS — I10 Essential (primary) hypertension: Secondary | ICD-10-CM | POA: Diagnosis not present

## 2019-09-21 ENCOUNTER — Other Ambulatory Visit: Payer: Self-pay

## 2019-09-21 ENCOUNTER — Emergency Department
Admission: EM | Admit: 2019-09-21 | Discharge: 2019-09-21 | Disposition: A | Discharge status: Home / Self Care | Payer: Managed Care, Other (non HMO) | Attending: Student in an Organized Health Care Education/Training Program | Admitting: Student in an Organized Health Care Education/Training Program

## 2019-09-21 DIAGNOSIS — T7840XA Allergy, unspecified, initial encounter: Secondary | ICD-10-CM | POA: Diagnosis present

## 2019-09-21 DIAGNOSIS — Z7984 Long term (current) use of oral hypoglycemic drugs: Secondary | ICD-10-CM | POA: Diagnosis not present

## 2019-09-21 DIAGNOSIS — Z91030 Bee allergy status: Secondary | ICD-10-CM | POA: Diagnosis not present

## 2019-09-21 DIAGNOSIS — T63441A Toxic effect of venom of bees, accidental (unintentional), initial encounter: Secondary | ICD-10-CM | POA: Diagnosis not present

## 2019-09-21 DIAGNOSIS — Z79899 Other long term (current) drug therapy: Secondary | ICD-10-CM | POA: Insufficient documentation

## 2019-09-21 DIAGNOSIS — I5021 Acute systolic (congestive) heart failure: Secondary | ICD-10-CM | POA: Insufficient documentation

## 2019-09-21 DIAGNOSIS — Z7982 Long term (current) use of aspirin: Secondary | ICD-10-CM | POA: Insufficient documentation

## 2019-09-21 DIAGNOSIS — I110 Hypertensive heart disease with heart failure: Secondary | ICD-10-CM | POA: Diagnosis not present

## 2019-09-21 DIAGNOSIS — E119 Type 2 diabetes mellitus without complications: Secondary | ICD-10-CM | POA: Insufficient documentation

## 2019-09-21 MED ORDER — DEXAMETHASONE SODIUM PHOSPHATE 10 MG/ML IJ SOLN
10.0000 mg | Freq: Once | INTRAMUSCULAR | Status: AC
  Administered 2019-09-21: 10 mg via INTRAVENOUS
  Filled 2019-09-21: qty 1

## 2019-09-21 MED ORDER — EPINEPHRINE 0.3 MG/0.3ML IJ SOAJ: 0.3000 mg | INTRAMUSCULAR | 1 refills | Status: AC | PRN

## 2019-09-21 NOTE — Discharge Instructions (Signed)
Follow-up with your primary care provider if any continued problems or concerns.  Continue Benadryl if needed for itching.  The steroid that was given to you in your IV may make you feel hyper, hungry, difficulty sleeping but this will be temporary.  You may also use Benadryl cream to rub into the area.  Information about the EpiPen and a website to get a coupon for your EpiPen is listed on your discharge papers.  Go to this website and download the coupon.  https://www.epipen.com/en/paying-for-epipen-and-generic

## 2019-09-21 NOTE — ED Provider Notes (Signed)
[facility] Emergency Department Provider Note  ____________________________________________   First MD Initiated Contact with Patient 09/21/19 1400     (approximate)  I have reviewed the triage vital signs and the nursing notes.   HISTORY  Chief Complaint Allergic Reaction    HPI Bryan Clark is a 49 y.o. male presents to the ED via EMS with allergic reaction from work.  Patient states that he was stung by a bee which possibly was a hornet on his left wrist.  He was given Benadryl 25 mg IV in route to the ED.  Patient has in the past had to use an EpiPen for some bee stings.  Currently he reports that he feels lightheaded and that his chest feels tight.  Patient did not use an EpiPen as he does not have 1 currently.       Past Medical History:  Diagnosis Date   Acute systolic heart failure (HCC)    exacerbation   Cardiomyopathy    nonischemic ejection fraction 20% by cardiac cath   High cholesterol    Hypertension     Patient Active Problem List   Diagnosis Date Noted   Diabetes (HCC) 04/14/2013   Nonischemic cardiomyopathy (HCC) 07/11/2010   Hypertension 07/11/2010   Hypercholesterolemia 07/11/2010    Past Surgical History:  Procedure Laterality Date   bilaterial pleural effusion     BLADDER SURGERY     trauma as a child during martial arts training    Prior to Admission medications   Medication Sig Start Date End Date Taking? Authorizing Provider  aspirin EC 81 MG tablet Take 81 mg by mouth daily.    [provider]  atorvastatin (LIPITOR) 40 MG tablet Take 1 tablet (40 mg total) by mouth daily. 04/27/16   Jodelle Gross, NP  carvedilol (COREG) 12.5 MG tablet TAKE 1 AND 1/2 TABLETS BY MOUTH TWICE DAILY WITH MEALS. 09/27/17   Antoine Poche, MD  Empagliflozin-Linagliptin 25-5 MG TABS Take 25 mg by mouth daily.    [provider]  EPINEPHrine (EPIPEN 2-PAK) 0.3 mg/0.3 mL IJ SOAJ injection  Inject 0.3 mLs (0.3 mg total) into the muscle as needed for anaphylaxis (As need for bee stings). 09/21/19   Tommi Rumps, PA-C  furosemide (LASIX) 20 MG tablet Take 1 tablet (20 mg total) by mouth daily. 04/27/16   Jodelle Gross, NP  lisinopril (PRINIVIL,ZESTRIL) 2.5 MG tablet Take 1 tablet (2.5 mg total) by mouth daily. 04/27/16   Jodelle Gross, NP  metFORMIN (GLUCOPHAGE) 500 MG tablet Take 1 tablet (500 mg total) by mouth daily. Patient taking differently: Take 500 mg by mouth 2 (two) times daily with a meal.  02/14/14   Jodelle Gross, NP  Multiple Vitamin (MULTIVITAMIN WITH MINERALS) TABS tablet Take 1 tablet by mouth daily.    [provider]  Omega-3 Fatty Acids (FISH OIL) 500 MG CAPS Take 500 mg by mouth.    [provider]    Allergies Bee venom  Family History  Problem Relation Age of Onset   Heart attack Mother    Hypertension Mother    Heart disease Sister     Social History Social History   Tobacco Use   Smoking status: Never Smoker   Smokeless tobacco: Never Used  Substance Use Topics   Alcohol use: Yes    Comment: occasionally   Drug use: Not Currently    Review of Systems Constitutional: No fever/chills.  Positive dizziness, lightheaded. Eyes:  No visual changes. ENT: No sore throat. Cardiovascular: Denies chest pain. Respiratory: Denies shortness of breath. Gastrointestinal: No abdominal pain.  No nausea, no vomiting.   Musculoskeletal: Negative for back pain. Skin: Positive bee sting left wrist. Neurological: Negative for headaches, focal weakness or numbness.  ____________________________________________   PHYSICAL EXAM:  VITAL SIGNS: ED Triage Vitals  Enc Vitals Group     BP 09/21/19 1354 (!) 156/105     Pulse Rate 09/21/19 1354 92     Resp 09/21/19 1354 16     Temp 09/21/19 1355 98.1 F (36.7 C)     Temp Source 09/21/19 1355 Oral     SpO2 09/21/19 1354 97 %     Weight 09/21/19 1353 208 lb (94.3 kg)      Height 09/21/19 1353 5\' 9"  (1.753 m)     Head Circumference --      Peak Flow --      Pain Score 09/21/19 1353 2     Pain Loc --      Pain Edu? --      Excl. in GC? --     Constitutional: Alert and oriented. Well appearing and in no acute distress. Eyes: Conjunctivae are normal.  Head: Atraumatic. Nose: No congestion/rhinnorhea. Mouth/Throat: Mucous membranes are moist.  Oropharynx non-erythematous.  No edema noted posterior pharynx.  Patient is able to talk in complete sentences without any difficulty. Neck: No stridor.   Cardiovascular: Normal rate, regular rhythm. Grossly normal heart sounds.  Good peripheral circulation. Respiratory: Normal respiratory effort.  No retractions. Lungs CTAB. Musculoskeletal: No lower extremity tenderness nor edema.  No joint effusions. Neurologic:  Normal speech and language. No gross focal neurologic deficits are appreciated. No gait instability. Skin:  Skin is warm, dry and intact.  Initially on the radial aspect of the left wrist there was a small erythematous area with minimal soft tissue edema.  This is where he was stung by bee. Psychiatric: Mood and affect are normal. Speech and behavior are normal.  ____________________________________________   LABS (all labs ordered are listed, but only abnormal results are displayed)  Labs Reviewed - No data to display   PROCEDURES  Procedure(s) performed (including Critical Care):  Procedures   ____________________________________________   INITIAL IMPRESSION / ASSESSMENT AND PLAN / ED COURSE  As part of my medical decision making, I reviewed the following data within the electronic MEDICAL RECORD NUMBER Notes from prior ED visits and [place] Controlled Substance Database  ----------------------------------------- 3:19 PM on 09/21/2019 ----------------------------------------- No continuing symptoms and wrist has no redness and minimal edema.  Patient is not having any respiratory difficulty.   Lungs still continue to sound clear and patient is able to talk without any difficulties.  We talked about continuing Benadryl if needed for itching.  A prescription for an EpiPen was written for him.  Also a website for a coupon on the EpiPen was given to his wife to help with this cost.   ____________________________________________   FINAL CLINICAL IMPRESSION(S) / ED DIAGNOSES  Final diagnoses:  Local reaction to bee sting, accidental or unintentional, initial encounter     ED Discharge Orders         Ordered    EPINEPHrine (EPIPEN 2-PAK) 0.3 mg/0.3 mL IJ SOAJ injection  As needed     Discontinue  Reprint     09/21/19 1512           Note:  This document was prepared using Dragon voice recognition software and may include unintentional dictation errors.  Tommi Rumps, PA-C 09/21/19 1522    Willy Eddy, MD 09/21/19 [phone]

## 2019-09-21 NOTE — ED Triage Notes (Signed)
Pt arrives [facility] for allergic reaction from work. L wrist redness/swelling from sting. Pt states looked like a hornet maybe, "it was black." EMS gave 25mg  benadryl, arrives with 18 R AC. A&O, no distress noted. No diff breathing noted. EMS VSS.

## 2019-11-10 IMAGING — DX DG CHEST 2V
2 series · 2 of 2 positions shown · non-contrast
Comparison: 08/31/2016

CLINICAL DATA: Chest pain on left

EXAM:
CHEST  2 VIEW

[chest pa]
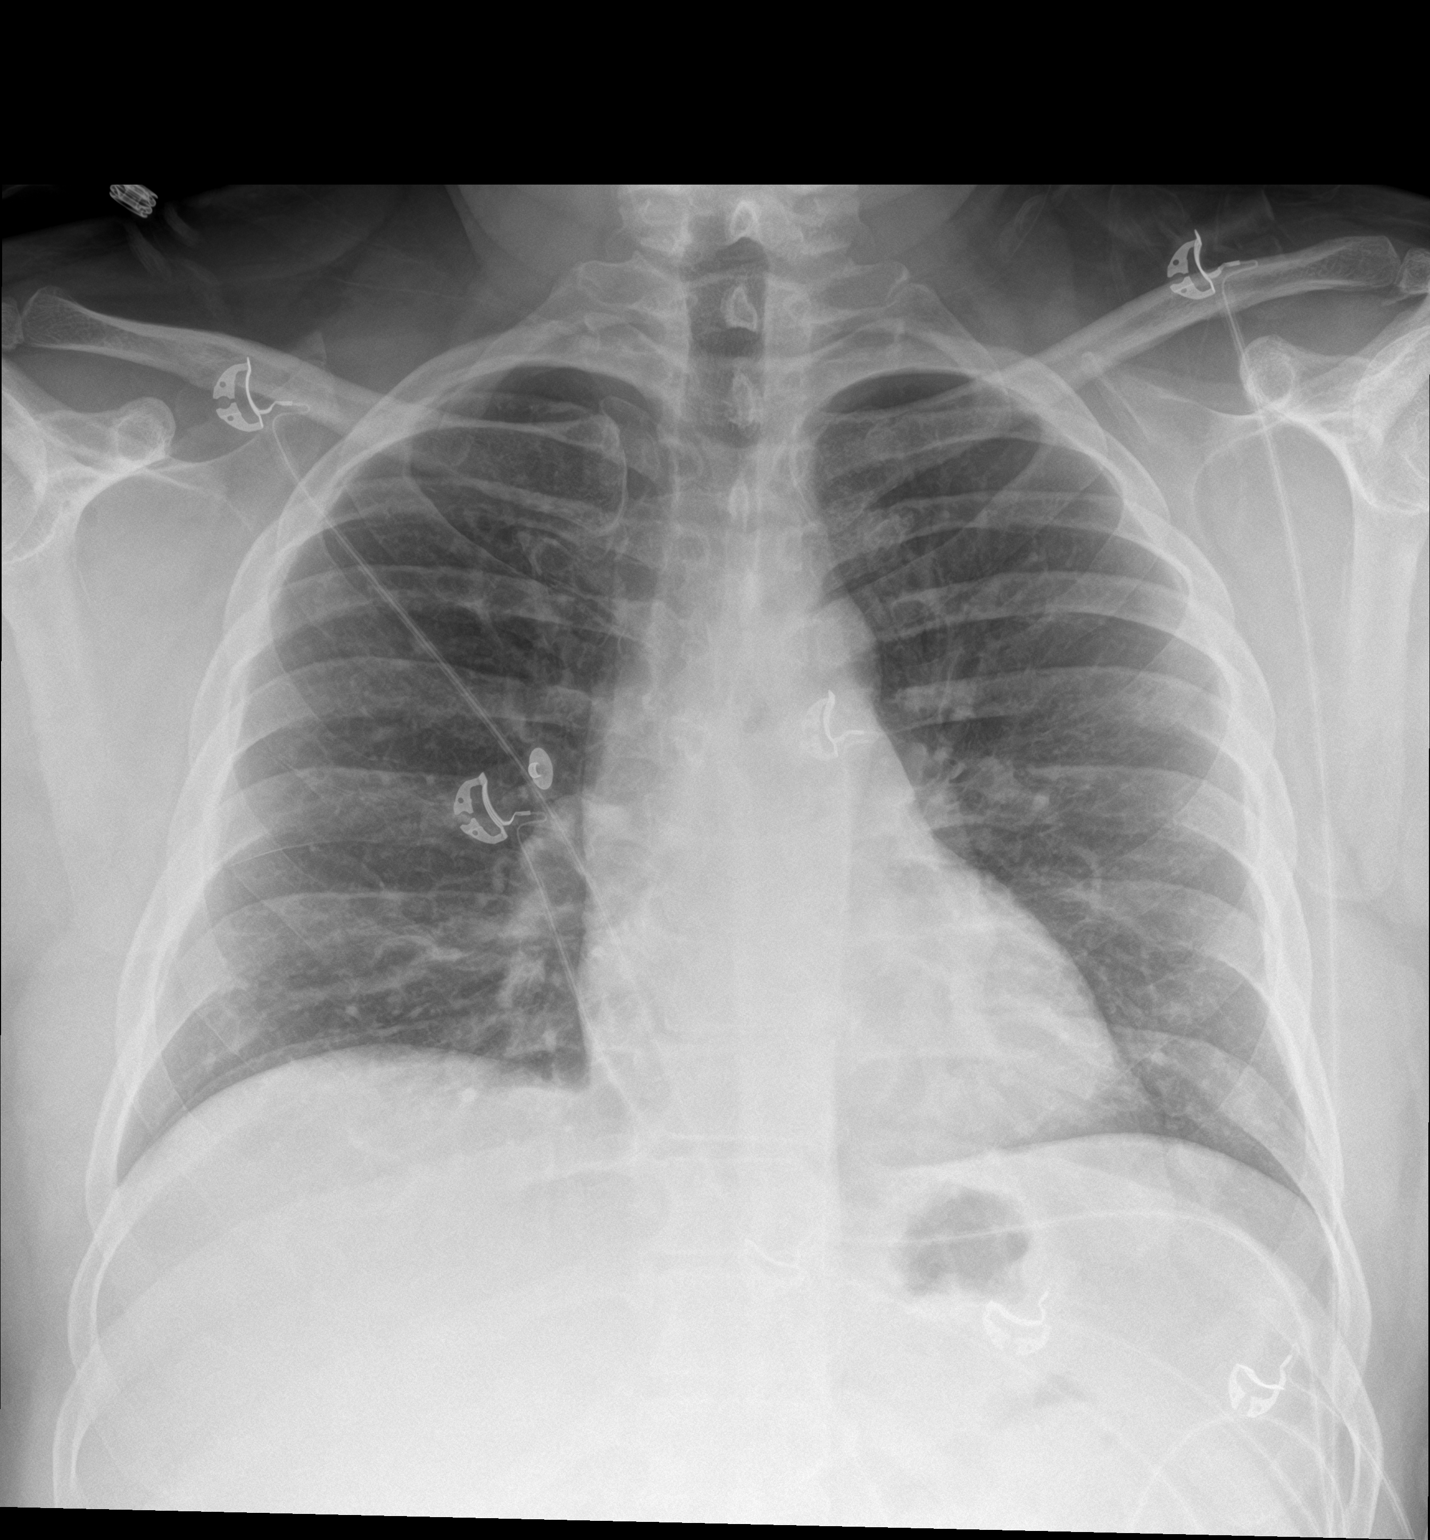

[chest lat]
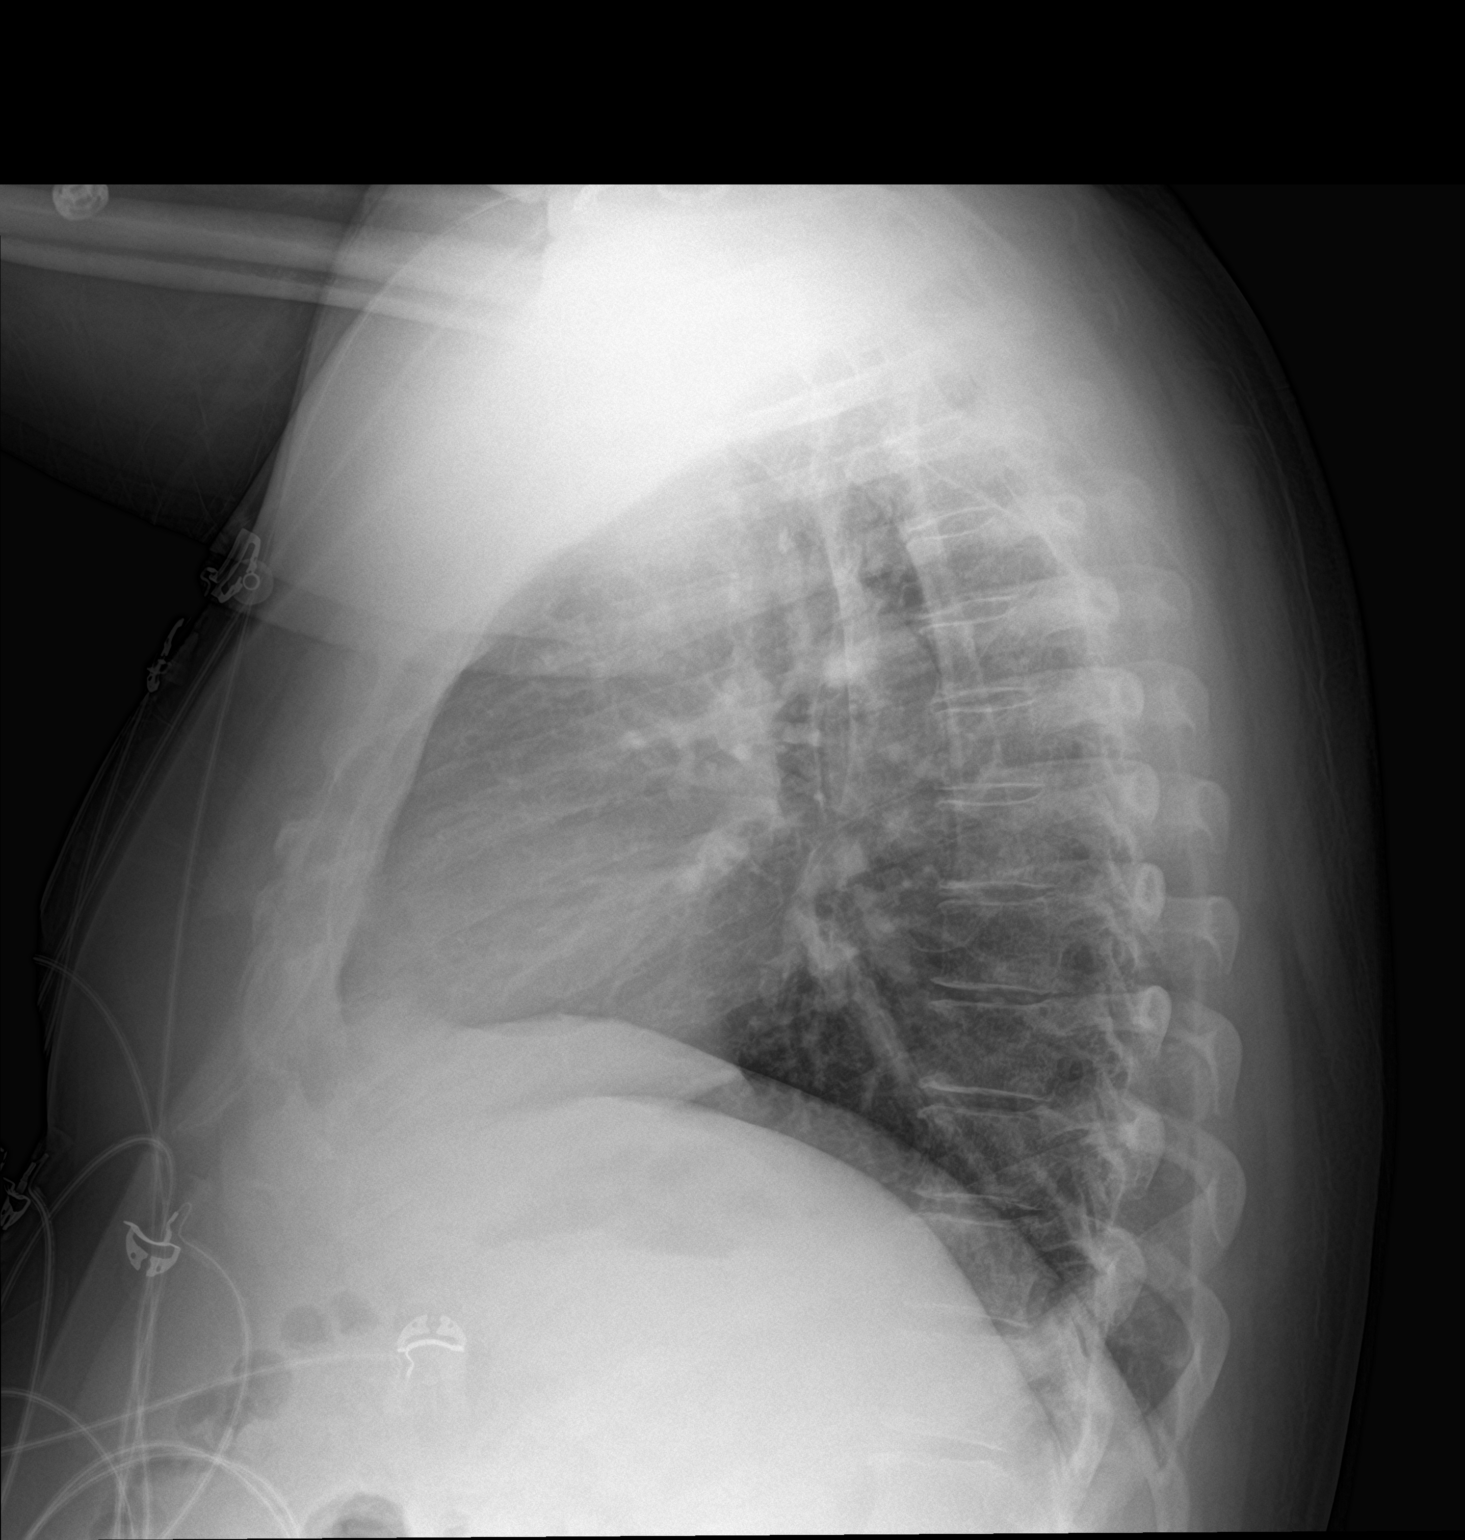

[2 of 2 positions shown; findings below may reference images not displayed]

FINDINGS: Heart and mediastinal contours are within normal limits. No focal
opacities or effusions. No acute bony abnormality.
IMPRESSION: No active cardiopulmonary disease.

## 2020-08-06 ENCOUNTER — Encounter: Payer: Self-pay | Admitting: Physician Assistant

## 2020-08-06 ENCOUNTER — Ambulatory Visit: Payer: Managed Care, Other (non HMO) | Admitting: Physician Assistant

## 2020-08-06 VITALS — BP 146/96 | HR 83 | Temp 97.4°F | Resp 17 | Ht 70.0 in | Wt 206.0 lb

## 2020-08-06 DIAGNOSIS — Z0000 Encounter for general adult medical examination without abnormal findings: Secondary | ICD-10-CM

## 2020-08-06 DIAGNOSIS — Z008 Encounter for other general examination: Secondary | ICD-10-CM

## 2020-08-06 NOTE — Progress Notes (Signed)
Subjective:    Patient ID: Bryan Clark, male    DOB: September 19, 1970, 50 y.o.   MRN: [id number]  HPI   Bryan Clark, [facility]  presents for [employer] and exam  New to our clinic  Denies any particular concerns today denies h/a, blurred vision, dizzy , sob, doe Coming for insurance review.  He has reviewed drug list and states it is accurate except for need to add Welchol  Chart revals hx of dilated cardiomyopathy, systolic CHF, hx bilateral pleural effusion, bladder stone. His PCP is Dr B. 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  Wears 24 -30 pounds of safety equipment  bullet proof vest daily OTJ  Non-smoker  Covid vaccine NOT DONE per patient  Review of Systems  Denies any concerns at this time     Objective:   Physical Exam Vitals and nursing note (  BP 148/94  repeat 146/96 ) reviewed.  Constitutional:      General: He is not in acute distress.    Appearance: Normal appearance.     Comments: overweight  HENT:     Head: Normocephalic and atraumatic.     Right Ear: Tympanic membrane, ear canal and external ear normal.     Left Ear: Tympanic membrane, ear canal and external ear normal.     Nose: Nose normal.     Mouth/Throat:     Mouth: Mucous membranes are moist.     Comments: DDS q 6 months Eyes:     Extraocular Movements: Extraocular movements intact.     Conjunctiva/sclera: Conjunctivae normal.  Cardiovascular:     Rate and Rhythm: Normal rate and regular rhythm.     Pulses: Normal pulses.     Heart sounds: Normal heart sounds.     Comments: 2021: 140/95; 135/95;       08/06/2020   146/96 Pulmonary:     Effort: Pulmonary  effort is normal.     Breath sounds: Normal breath sounds.  Abdominal:     General: Bowel sounds are normal.     Palpations: There is no mass.     Tenderness: There is no abdominal tenderness. There is no guarding.  Genitourinary:    Comments: defer Musculoskeletal:        General: No tenderness. Normal range of motion.     Cervical back: Normal range of motion and neck supple. No tenderness.  Lymphadenopathy:     Cervical: No cervical adenopathy.  Skin:    General: Skin is warm and dry.     Capillary Refill: Capillary refill takes less than 2 seconds.  Neurological:     General: No focal deficit present.     Mental Status: He is alert.     Cranial Nerves: No cranial nerve deficit.     Gait: Gait normal.     Deep Tendon Reflexes: Reflexes normal.  Psychiatric:        Mood and Affect: Mood normal.        Behavior: Behavior normal.       Assessment & Plan:   Please contact PCP for renewal EpiPen Rx- summer activities such as [place] walk increase exposure to biting insects  Discussed elevated BP readings today and request that he follow up in AM with call to PCP reporting same He has not heard of DASH diet and will add for his review and to discuss with PCP  Encouraged to be Sodium concious and watch labels- he realizes he had [employer] biscuit for breakfast yesterday. Encourage a steady weight loss attempt 1/2 - 1  lb per week   (206lb      5'10") To trim toward goal weight -   Questions fielded recommendations reviewed Labs to be reported as available Patient states My Chart is active

## 2020-08-06 NOTE — Patient Instructions (Signed)
Hypertension, Adult Hypertension is another name for high blood pressure. High blood pressure forces your heart to work harder to pump blood. This can cause problems over time. There are two numbers in a blood pressure reading. There is a top number (systolic) over a bottom number (diastolic). It is best to have a blood pressure that is below 120/80. Healthy choices can help lower your blood pressure, or you may need medicine to help lower it. What are the causes? The cause of this condition is not known. Some conditions may be related to high blood pressure. What increases the risk?  Smoking.  Having type 2 diabetes mellitus, high cholesterol, or both.  Not getting enough exercise or physical activity.  Being overweight.  Having too much fat, sugar, calories, or salt (sodium) in your diet.  Drinking too much alcohol.  Having long-term (chronic) kidney disease.  Having a family history of high blood pressure.  Age. Risk increases with age.  Race. You may be at higher risk if you are African American.  Gender. Men are at higher risk than women before age 45. After age 65, women are at higher risk than men.  Having obstructive sleep apnea.  Stress. What are the signs or symptoms?  High blood pressure may not cause symptoms. Very high blood pressure (hypertensive crisis) may cause: ? Headache. ? Feelings of worry or nervousness (anxiety). ? Shortness of breath. ? Nosebleed. ? A feeling of being sick to your stomach (nausea). ? Throwing up (vomiting). ? Changes in how you see. ? Very bad chest pain. ? Seizures. How is this treated?  This condition is treated by making healthy lifestyle changes, such as: ? Eating healthy foods. ? Exercising more. ? Drinking less alcohol.  Your health care provider may prescribe medicine if lifestyle changes are not enough to get your blood pressure under control, and if: ? Your top number is above 130. ? Your bottom number is above  80.  Your personal target blood pressure may vary. Follow these instructions at home: Eating and drinking  If told, follow the DASH eating plan. To follow this plan: ? Fill one half of your plate at each meal with fruits and vegetables. ? Fill one fourth of your plate at each meal with whole grains. Whole grains include whole-wheat pasta, brown rice, and whole-grain bread. ? Eat or drink low-fat dairy products, such as skim milk or low-fat yogurt. ? Fill one fourth of your plate at each meal with low-fat (lean) proteins. Low-fat proteins include fish, chicken without skin, eggs, beans, and tofu. ? Avoid fatty meat, cured and processed meat, or chicken with skin. ? Avoid pre-made or processed food.  Eat less than 1,500 mg of salt each day.  Do not drink alcohol if: ? Your doctor tells you not to drink. ? You are pregnant, may be pregnant, or are planning to become pregnant.  If you drink alcohol: ? Limit how much you use to:  0-1 drink a day for women.  0-2 drinks a day for men. ? Be aware of how much alcohol is in your drink. In the [place] one drink equals one 12 oz bottle of beer (355 mL), one 5 oz glass of wine (148 mL), or one 1 oz glass of hard liquor (44 mL).   Lifestyle  Work with your doctor to stay at a healthy weight or to lose weight. Ask your doctor what the best weight is for you.  Get at least 30 minutes of exercise most   days of the week. This may include walking, swimming, or biking.  Get at least 30 minutes of exercise that strengthens your muscles (resistance exercise) at least 3 days a week. This may include lifting weights or doing Pilates.  Do not use any products that contain nicotine or tobacco, such as cigarettes, e-cigarettes, and chewing tobacco. If you need help quitting, ask your doctor.  Check your blood pressure at home as told by your doctor.  Keep all follow-up visits as told by your doctor. This is important.   Medicines  Take over-the-counter  and prescription medicines only as told by your doctor. Follow directions carefully.  Do not skip doses of blood pressure medicine. The medicine does not work as well if you skip doses. Skipping doses also puts you at risk for problems.  Ask your doctor about side effects or reactions to medicines that you should watch for. Contact a doctor if you:  Think you are having a reaction to the medicine you are taking.  Have headaches that keep coming back (recurring).  Feel dizzy.  Have swelling in your ankles.  Have trouble with your vision. Get help right away if you:  Get a very bad headache.  Start to feel mixed up (confused).  Feel weak or numb.  Feel faint.  Have very bad pain in your: ? Chest. ? Belly (abdomen).  Throw up more than once.  Have trouble breathing. Summary  Hypertension is another name for high blood pressure.  High blood pressure forces your heart to work harder to pump blood.  For most people, a normal blood pressure is less than 120/80.  Making healthy choices can help lower blood pressure. If your blood pressure does not get lower with healthy choices, you may need to take medicine. This information is not intended to replace advice given to you by your health care provider. Make sure you discuss any questions you have with your health care provider. Document Revised: 11/03/2017 Document Reviewed: 11/03/2017 Elsevier Patient Education  2021 Elsevier Inc. https://www.mata.com/.pdf">  DASH Eating Plan DASH stands for Dietary Approaches to Stop Hypertension. The DASH eating plan is a healthy eating plan that has been shown to:  Reduce high blood pressure (hypertension).  Reduce your risk for type 2 diabetes, heart disease, and stroke.  Help with weight loss. What are tips for following this plan? Reading food labels  Check food labels for the amount of salt (sodium) per serving. Choose foods with less  than 5 percent of the Daily Value of sodium. Generally, foods with less than 300 milligrams (mg) of sodium per serving fit into this eating plan.  To find whole grains, look for the word "whole" as the first word in the ingredient list. Shopping  Buy products labeled as "low-sodium" or "no salt added."  Buy fresh foods. Avoid canned foods and pre-made or frozen meals. Cooking  Avoid adding salt when cooking. Use salt-free seasonings or herbs instead of table salt or sea salt. Check with your health care provider or pharmacist before using salt substitutes.  Do not fry foods. Cook foods using healthy methods such as baking, boiling, grilling, roasting, and broiling instead.  Cook with heart-healthy oils, such as olive, canola, avocado, soybean, or sunflower oil. Meal planning  Eat a balanced diet that includes: ? 4 or more servings of fruits and 4 or more servings of vegetables each day. Try to fill one-half of your plate with fruits and vegetables. ? 6-8 servings of whole grains each day. ?  Less than 6 oz (170 g) of lean meat, poultry, or fish each day. A 3-oz (85-g) serving of meat is about the same size as a deck of cards. One egg equals 1 oz (28 g). ? 2-3 servings of low-fat dairy each day. One serving is 1 cup (237 mL). ? 1 serving of nuts, seeds, or beans 5 times each week. ? 2-3 servings of heart-healthy fats. Healthy fats called omega-3 fatty acids are found in foods such as walnuts, flaxseeds, fortified milks, and eggs. These fats are also found in cold-water fish, such as sardines, salmon, and mackerel.  Limit how much you eat of: ? Canned or prepackaged foods. ? Food that is high in trans fat, such as some fried foods. ? Food that is high in saturated fat, such as fatty meat. ? Desserts and other sweets, sugary drinks, and other foods with added sugar. ? Full-fat dairy products.  Do not salt foods before eating.  Do not eat more than 4 egg yolks a week.  Try to eat at  least 2 vegetarian meals a week.  Eat more home-cooked food and less restaurant, buffet, and fast food.   Lifestyle  When eating at a restaurant, ask that your food be prepared with less salt or no salt, if possible.  If you drink alcohol: ? Limit how much you use to:  0-1 drink a day for women who are not pregnant.  0-2 drinks a day for men. ? Be aware of how much alcohol is in your drink. In the [place] one drink equals one 12 oz bottle of beer (355 mL), one 5 oz glass of wine (148 mL), or one 1 oz glass of hard liquor (44 mL). General information  Avoid eating more than 2,300 mg of salt a day. If you have hypertension, you may need to reduce your sodium intake to 1,500 mg a day.  Work with your health care provider to maintain a healthy body weight or to lose weight. Ask what an ideal weight is for you.  Get at least 30 minutes of exercise that causes your heart to beat faster (aerobic exercise) most days of the week. Activities may include walking, swimming, or biking.  Work with your health care provider or dietitian to adjust your eating plan to your individual calorie needs. What foods should I eat? Fruits All fresh, dried, or frozen fruit. Canned fruit in natural juice (without added sugar). Vegetables Fresh or frozen vegetables (raw, steamed, roasted, or grilled). Low-sodium or reduced-sodium tomato and vegetable juice. Low-sodium or reduced-sodium tomato sauce and tomato paste. Low-sodium or reduced-sodium canned vegetables. Grains Whole-grain or whole-wheat bread. Whole-grain or whole-wheat pasta. Brown rice. Orpah Cobb. Bulgur. Whole-grain and low-sodium cereals. Pita bread. Low-fat, low-sodium crackers. Whole-wheat flour tortillas. Meats and other proteins Skinless chicken or [place]. Ground chicken or [place]. Pork with fat trimmed off. Fish and seafood. Egg whites. Dried beans, peas, or lentils. Unsalted nuts, nut butters, and seeds. Unsalted canned beans. Lean cuts of  beef with fat trimmed off. Low-sodium, lean precooked or cured meat, such as sausages or meat loaves. Dairy Low-fat (1%) or fat-free (skim) milk. Reduced-fat, low-fat, or fat-free cheeses. Nonfat, low-sodium ricotta or cottage cheese. Low-fat or nonfat yogurt. Low-fat, low-sodium cheese. Fats and oils Soft margarine without trans fats. Vegetable oil. Reduced-fat, low-fat, or light mayonnaise and salad dressings (reduced-sodium). Canola, safflower, olive, avocado, soybean, and sunflower oils. Avocado. Seasonings and condiments Herbs. Spices. Seasoning mixes without salt. Other foods Unsalted popcorn and pretzels. Fat-free sweets.  The items listed above may not be a complete list of foods and beverages you can eat. Contact a dietitian for more information. What foods should I avoid? Fruits Canned fruit in a light or heavy syrup. Fried fruit. Fruit in cream or butter sauce. Vegetables Creamed or fried vegetables. Vegetables in a cheese sauce. Regular canned vegetables (not low-sodium or reduced-sodium). Regular canned tomato sauce and paste (not low-sodium or reduced-sodium). Regular tomato and vegetable juice (not low-sodium or reduced-sodium). Rosita Fire. Olives. Grains Baked goods made with fat, such as croissants, muffins, or some breads. Dry pasta or rice meal packs. Meats and other proteins Fatty cuts of meat. Ribs. Fried meat. Tomasa Blase. Bologna, salami, and other precooked or cured meats, such as sausages or meat loaves. Fat from the back of a pig (fatback). Bratwurst. Salted nuts and seeds. Canned beans with added salt. Canned or smoked fish. Whole eggs or egg yolks. Chicken or [place] with skin. Dairy Whole or 2% milk, cream, and half-and-half. Whole or full-fat cream cheese. Whole-fat or sweetened yogurt. Full-fat cheese. Nondairy creamers. Whipped toppings. Processed cheese and cheese spreads. Fats and oils Butter. Stick margarine. Lard. Shortening. Ghee. Bacon fat. Tropical oils, such as  coconut, palm kernel, or palm oil. Seasonings and condiments Onion salt, garlic salt, seasoned salt, table salt, and sea salt. Worcestershire sauce. Tartar sauce. Barbecue sauce. Teriyaki sauce. Soy sauce, including reduced-sodium. Steak sauce. Canned and packaged gravies. Fish sauce. Oyster sauce. Cocktail sauce. Store-bought horseradish. Ketchup. Mustard. Meat flavorings and tenderizers. Bouillon cubes. Hot sauces. Pre-made or packaged marinades. Pre-made or packaged taco seasonings. Relishes. Regular salad dressings. Other foods Salted popcorn and pretzels. The items listed above may not be a complete list of foods and beverages you should avoid. Contact a dietitian for more information. Where to find more information  National Heart, Lung, and Blood Institute: PopSteam.is  American Heart Association: www.heart.org  Academy of Nutrition and Dietetics: www.eatright.org  National Kidney Foundation: www.kidney.org Summary  The DASH eating plan is a healthy eating plan that has been shown to reduce high blood pressure (hypertension). It may also reduce your risk for type 2 diabetes, heart disease, and stroke.  When on the DASH eating plan, aim to eat more fresh fruits and vegetables, whole grains, lean proteins, low-fat dairy, and heart-healthy fats.  With the DASH eating plan, you should limit salt (sodium) intake to 2,300 mg a day. If you have hypertension, you may need to reduce your sodium intake to 1,500 mg a day.  Work with your health care provider or dietitian to adjust your eating plan to your individual calorie needs. This information is not intended to replace advice given to you by your health care provider. Make sure you discuss any questions you have with your health care provider. Document Revised: 01/27/2019 Document Reviewed: 01/27/2019 Elsevier Patient Education  2021 [employer].

## 2020-08-07 LAB — LIPID PANEL
Chol/HDL Ratio: 4 ratio (ref 0.0–5.0)
Cholesterol, Total: 139 mg/dL (ref 100–199)
HDL: 35 mg/dL — ABNORMAL LOW (ref >39)
LDL Chol Calc (NIH): 47 mg/dL (ref 0–99)
Triglycerides: 381 mg/dL — ABNORMAL HIGH (ref 0–149)
VLDL Cholesterol Cal: 57 mg/dL — ABNORMAL HIGH (ref 5–40)

## 2020-08-07 LAB — GLUCOSE, RANDOM: Glucose: 132 mg/dL — ABNORMAL HIGH (ref 65–99)

## 2020-09-07 ENCOUNTER — Emergency Department (HOSPITAL_COMMUNITY)
Admission: EM | Admit: 2020-09-07 | Discharge: 2020-09-08 | Disposition: A | Discharge status: Home / Self Care | Payer: Managed Care, Other (non HMO) | Attending: Emergency Medicine | Admitting: Emergency Medicine

## 2020-09-07 ENCOUNTER — Other Ambulatory Visit: Payer: Self-pay

## 2020-09-07 ENCOUNTER — Encounter (HOSPITAL_COMMUNITY): Payer: Self-pay | Admitting: Emergency Medicine

## 2020-09-07 ENCOUNTER — Emergency Department (HOSPITAL_COMMUNITY): Payer: Managed Care, Other (non HMO)

## 2020-09-07 DIAGNOSIS — Z7982 Long term (current) use of aspirin: Secondary | ICD-10-CM | POA: Insufficient documentation

## 2020-09-07 DIAGNOSIS — I5021 Acute systolic (congestive) heart failure: Secondary | ICD-10-CM | POA: Insufficient documentation

## 2020-09-07 DIAGNOSIS — E119 Type 2 diabetes mellitus without complications: Secondary | ICD-10-CM | POA: Insufficient documentation

## 2020-09-07 DIAGNOSIS — Z7984 Long term (current) use of oral hypoglycemic drugs: Secondary | ICD-10-CM | POA: Insufficient documentation

## 2020-09-07 DIAGNOSIS — I110 Hypertensive heart disease with heart failure: Secondary | ICD-10-CM | POA: Diagnosis not present

## 2020-09-07 DIAGNOSIS — R0789 Other chest pain: Secondary | ICD-10-CM | POA: Diagnosis present

## 2020-09-07 DIAGNOSIS — Z79899 Other long term (current) drug therapy: Secondary | ICD-10-CM | POA: Insufficient documentation

## 2020-09-07 LAB — CBC WITH DIFFERENTIAL/PLATELET
Abs Immature Granulocytes: 0.02 10*3/uL (ref 0.00–0.07)
Basophils Absolute: 0 10*3/uL (ref 0.0–0.1)
Basophils Relative: 0 %
Eosinophils Absolute: 0 10*3/uL (ref 0.0–0.5)
Eosinophils Relative: 0 %
HCT: 50.4 % (ref 39.0–52.0)
Hemoglobin: 16.9 g/dL (ref 13.0–17.0)
Immature Granulocytes: 0 %
Lymphocytes Relative: 19 %
Lymphs Abs: 1.6 10*3/uL (ref 0.7–4.0)
MCH: 30.7 pg (ref 26.0–34.0)
MCHC: 33.5 g/dL (ref 30.0–36.0)
MCV: 91.6 fL (ref 80.0–100.0)
Monocytes Absolute: 0.5 10*3/uL (ref 0.1–1.0)
Monocytes Relative: 6 %
Neutro Abs: 6 10*3/uL (ref 1.7–7.7)
Neutrophils Relative %: 75 %
Platelets: 152 10*3/uL (ref 150–400)
RBC: 5.5 MIL/uL (ref 4.22–5.81)
RDW: 12.8 % (ref 11.5–15.5)
WBC: 8 10*3/uL (ref 4.0–10.5)
nRBC: 0 % (ref 0.0–0.2)

## 2020-09-07 LAB — BASIC METABOLIC PANEL
Anion gap: 11 (ref 5–15)
BUN: 13 mg/dL (ref 6–20)
CO2: 22 mmol/L (ref 22–32)
Calcium: 10 mg/dL (ref 8.9–10.3)
Chloride: 105 mmol/L (ref 98–111)
Creatinine, Ser: 1.03 mg/dL (ref 0.61–1.24)
GFR, Estimated: >60 mL/min (ref >60)
Glucose, Bld: 159 mg/dL — ABNORMAL HIGH (ref 70–99)
Potassium: 3.9 mmol/L (ref 3.5–5.1)
Sodium: 138 mmol/L (ref 135–145)

## 2020-09-07 LAB — TROPONIN I (HIGH SENSITIVITY): Troponin I (High Sensitivity): 7 ng/L (ref <18)

## 2020-09-07 LAB — BRAIN NATRIURETIC PEPTIDE: B Natriuretic Peptide: 59.4 pg/mL (ref 0.0–100.0)

## 2020-09-07 NOTE — ED Triage Notes (Signed)
Pt c/o acute onset of chest pain  and sob today. HX CHF, denies increased swelling. Endorses weight loss. States compliance with medication. 324 ASA prior to arrival.

## 2020-09-07 NOTE — ED Provider Notes (Signed)
Emergency Medicine Provider Triage Evaluation Note  BRODYN DEPUY , a 50 y.o. male  was evaluated in triage.  Pt complains of chest tightness/pain that began around 2 PM with associated SOB. He took 3 baby aspirin with mild relief however later on the pain worsened and he became nauseated with diaphoresis. Pt reports hx of CHF - denies missing any doses of his fluid pill. Denies leg swelling or abdominal distention. Denies hx of MI however has positive Fhx of MI. Currently 6/10 pain. Non smoker.  Review of Systems  Positive: + chest pain, nausea, SOB, diaphoresis Negative: - vomiting  Physical Exam  BP (!) 154/101 (BP Location: Right Arm)   Pulse 81   Temp 98.2 F (36.8 C) (Oral)   Resp 16   Ht 5\' 10"  (1.778 m)   Wt 90.7 kg   SpO2 97%   BMI 28.70 kg/m  Gen:   Awake, no distress   Resp:  Normal effort  MSK:   Moves extremities without difficulty  Other:  Nonpitting edema bilaterally  Medical Decision Making  Medically screening exam initiated at 10:33 PM.  Appropriate orders placed.  was informed that the remainder of the evaluation will be completed by another provider, this initial triage assessment does not replace that evaluation, and the importance of remaining in the ED until their evaluation is complete.  ACS workup started.    [name], PA-C 09/07/20 2235    2236, MD 09/07/20 [phone]

## 2020-09-08 LAB — TROPONIN I (HIGH SENSITIVITY): Troponin I (High Sensitivity): 7 ng/L (ref <18)

## 2020-09-08 MED ORDER — ALUM & MAG HYDROXIDE-SIMETH 200-200-20 MG/5ML PO SUSP
30.0000 mL | Freq: Once | ORAL | Status: AC
  Administered 2020-09-08: 30 mL via ORAL
  Filled 2020-09-08: qty 30

## 2020-09-08 MED ORDER — LIDOCAINE VISCOUS HCL 2 % MT SOLN
15.0000 mL | Freq: Once | OROMUCOSAL | Status: AC
  Administered 2020-09-08: 15 mL via ORAL
  Filled 2020-09-08: qty 15

## 2020-09-08 NOTE — Discharge Instructions (Addendum)
You have been evaluated for your chest pain.  Fortunately no concerning findings were noted during this visit.  Please call and follow-up closely with your cardiologist for outpatient care.  Return promptly to the ER if his symptoms persist or worsen.

## 2020-09-08 NOTE — ED Provider Notes (Signed)
[facility] EMERGENCY DEPARTMENT Provider Note   CSN: [id number] Arrival date & time: 09/07/20  2211     History Chief Complaint  Patient presents with   Chest Pain    Bryan Clark is a 50 y.o. male.  The history is provided by the patient and medical records. No language interpreter was used.  Chest Pain  50 year old male significant history of nonischemic cardiomyopathy, CHF, hypertension, hypercholesterolemia, diabetes presenting for evaluation of chest pain.  Patient report he developed pain to his midsternal region approximately 2 PM yesterday while at work.  Pain is described as a burning sensation with some mild tightness.  He also felt lightheadedness, and a bit clammy when the pain became intense.  Pain was nonradiating without any associated nausea or shortness of breath.  He did took and aspirin but did not notice any significant improvement.  The pain has been persistent since but it is improving.  No associated fever productive cough sore throat vomiting diarrhea increased fluid gain.  He has been compliant with his medication.  He admits to eating 3 slices of pizza for lunch and also ate pizza the night before.  He does have a cardiologist but have not follow-up recently.  He does weigh himself regularly due to online history of CHF and has not noticed any significant fluid retention.  Unfortunately patient has been in the department for the past 9 hours prior to my evaluation due to prolonged wait secondary to condition of pandemic.  Past Medical History:  Diagnosis Date   Acute systolic heart failure (HCC)    exacerbation   Cardiomyopathy    nonischemic ejection fraction 20% by cardiac cath   High cholesterol    Hypertension     Patient Active Problem List   Diagnosis Date Noted   Diabetes (HCC) 04/14/2013   Nonischemic cardiomyopathy (HCC) 07/11/2010   Hypertension 07/11/2010   Hypercholesterolemia 07/11/2010    Past Surgical History:   Procedure Laterality Date   bilaterial pleural effusion     BLADDER SURGERY     trauma as a child during martial arts training       Family History  Problem Relation Age of Onset   Heart attack Mother    Hypertension Mother    Heart disease Sister     Social History   Tobacco Use   Smoking status: Never   Smokeless tobacco: Never  Substance Use Topics   Alcohol use: Yes    Comment: occasionally   Drug use: Not Currently    Home Medications Prior to Admission medications   Medication Sig Start Date End Date Taking? Authorizing Provider  aspirin EC 81 MG tablet Take 81 mg by mouth daily.    [provider]  atorvastatin (LIPITOR) 40 MG tablet Take 1 tablet (40 mg total) by mouth daily. 04/27/16   Jodelle Gross, NP  carvedilol (COREG) 12.5 MG tablet TAKE 1 AND 1/2 TABLETS BY MOUTH TWICE DAILY WITH MEALS. 09/27/17   Antoine Poche, MD  Empagliflozin-Linagliptin 25-5 MG TABS Take 25 mg by mouth daily.    [provider]  EPINEPHrine (EPIPEN 2-PAK) 0.3 mg/0.3 mL IJ SOAJ injection Inject 0.3 mLs (0.3 mg total) into the muscle as needed for anaphylaxis (As need for bee stings). 09/21/19   Tommi Rumps, PA-C  furosemide (LASIX) 20 MG tablet Take 1 tablet (20 mg total) by mouth daily. 04/27/16   Jodelle Gross, NP  lisinopril (PRINIVIL,ZESTRIL) 2.5 MG tablet Take 1 tablet (2.5  mg total) by mouth daily. 04/27/16   Jodelle Gross, NP  metFORMIN (GLUCOPHAGE) 500 MG tablet Take 1 tablet (500 mg total) by mouth daily. Patient taking differently: Take 500 mg by mouth 2 (two) times daily with a meal.  02/14/14   Jodelle Gross, NP  Multiple Vitamin (MULTIVITAMIN WITH MINERALS) TABS tablet Take 1 tablet by mouth daily.    [provider]  Omega-3 Fatty Acids (FISH OIL) 500 MG CAPS Take 500 mg by mouth.    [provider]    Allergies    Bee venom  Review of Systems   Review of Systems  Cardiovascular:  Positive for chest pain.   All other systems reviewed and are negative.  Physical Exam Updated Vital Signs BP (!) 146/96   Pulse 71   Temp 98.2 F (36.8 C) (Oral)   Resp 16   Ht 5\' 10"  (1.778 m)   Wt 90.7 kg   SpO2 99%   BMI 28.70 kg/m   Physical Exam Vitals and nursing note reviewed.  Constitutional:      General: He is not in acute distress.    Appearance: He is well-developed.  HENT:     Head: Atraumatic.  Eyes:     Conjunctiva/sclera: Conjunctivae normal.  Cardiovascular:     Rate and Rhythm: Normal rate and regular rhythm.     Pulses: Normal pulses.     Heart sounds: Normal heart sounds.  Pulmonary:     Effort: Pulmonary effort is normal.     Breath sounds: Normal breath sounds.  Chest:     Chest wall: No tenderness.  Abdominal:     Palpations: Abdomen is soft.     Tenderness: There is no abdominal tenderness.  Musculoskeletal:     Cervical back: Neck supple.     Comments: Trace edema to bilateral lower extremities with intact distal pulses.  Skin:    Findings: No rash.  Neurological:     Mental Status: He is alert and oriented to person, place, and time.  Psychiatric:        Mood and Affect: Mood normal.    ED Results / Procedures / Treatments   Labs (all labs ordered are listed, but only abnormal results are displayed) Labs Reviewed  BASIC METABOLIC PANEL - Abnormal; Notable for the following components:      Result Value   Glucose, Bld 159 (*)    All other components within normal limits  CBC WITH DIFFERENTIAL/PLATELET  BRAIN NATRIURETIC PEPTIDE  TROPONIN I (HIGH SENSITIVITY)  TROPONIN I (HIGH SENSITIVITY)    EKG None ED ECG REPORT   Date: 09/08/2020  Rate: 86  Rhythm: normal sinus rhythm  QRS Axis: normal  Intervals: normal  ST/T Wave abnormalities: nonspecific ST changes  Conduction Disutrbances:none  Narrative Interpretation:   Old EKG Reviewed: unchanged  I have personally reviewed the EKG tracing and agree with the computerized printout as  noted.   Radiology DG Chest 2 View  Result Date: 09/07/2020 CLINICAL DATA:  Chest pain and shortness of breath beginning today. Weight loss. EXAM: CHEST - 2 VIEW COMPARISON:  02/07/2017 FINDINGS: The heart size and mediastinal contours are within normal limits. Both lungs are clear. The visualized skeletal structures are unremarkable. IMPRESSION: No active cardiopulmonary disease. Electronically Signed   By: 14/04/2016 M.D.   On: 09/07/2020 23:13    Procedures Procedures   Medications Ordered in ED Medications  alum & mag hydroxide-simeth (MAALOX/MYLANTA) 200-200-20 MG/5ML suspension 30 mL (has no administration in  time range)    And  lidocaine (XYLOCAINE) 2 % viscous mouth solution 15 mL (has no administration in time range)    ED Course  I have reviewed the triage vital signs and the nursing notes.  Pertinent labs & imaging results that were available during my care of the patient were reviewed by me and considered in my medical decision making (see chart for details).    MDM Rules/Calculators/A&P                          BP (!) 146/96   Pulse 71   Temp 98.2 F (36.8 C) (Oral)   Resp 16   Ht 5\' 10"  (1.778 m)   Wt 90.7 kg   SpO2 99%   BMI 28.70 kg/m   Final Clinical Impression(s) / ED Diagnoses Final diagnoses:  Atypical chest pain    Rx / DC Orders ED Discharge Orders     None      8:06 AM Patient developed midsternal chest pain after eating lunch yesterday.  Pain is minimal at this time.  He has been in the department for the past 10 hours prior to my evaluation.  He is resting comfortably, vital signs stable, mildly hypertensive with a blood pressure of 146/96.  Pain started shortly after eating leftover pizza and he also ate pizza the night before which increase probability of having GERD related pain.  Work-up today has been essentially reassuring, EKG without concerning changes, negative delta troponin, chest x-ray unremarkable, normal BNP, labs are  reassuring.  HEAR score of 4, but he does have close f/u with cardiology for further evaluation and management.  GI cocktail given.  Will discharge with return precaution.    , PA-C 09/08/20 11/09/20    [id number], MD 09/09/20 [phone]

## 2020-12-30 ENCOUNTER — Other Ambulatory Visit: Payer: Self-pay

## 2020-12-30 ENCOUNTER — Ambulatory Visit: Payer: Managed Care, Other (non HMO) | Admitting: Internal Medicine

## 2020-12-30 VITALS — BP 150/96 | HR 90 | Ht 69.0 in | Wt 213.0 lb

## 2020-12-30 DIAGNOSIS — I10 Essential (primary) hypertension: Secondary | ICD-10-CM

## 2020-12-30 NOTE — Progress Notes (Signed)
Cardiology Office Note   Date:  12/30/2020   ID:  Bryan Clark, DOB 28-Oct-1970, MRN [id number]  PCP:  [facility]  Cardiologist:   Dietrich Pates, MD   Patient referred back to cardiology for continued cardiac care and for evaluation of chest tightness    History of Present Illness: Bryan Clark is a 50 y.o. male with a history of  NICM, HTN and HL     I saw him in cardiology in 2013  Cardiac cath showed no CAD   Echo in 2015 LVEF 45 to 50%.   He was last seen in clinic in 2018 Continues to follow at [facility]      He was seen in July   ONe night eating pizza   Developped chest tightness   Squeezing sensation in chest      Seen in ER at [facility] After that spell he has not had any further spells of chest discomfort     The patient says is breathing is OK   He is able to walk 6 miles per day   NO CP   No signif SOB    Also helps at a martial arts studio     Breakfast    Skipds Lunch 12:30   Tur[place]/cheese sandwich Dinner  Spaghetti   Sushi Drinks:  Water or sun tea     Current Meds  Medication Sig   lisinopril (ZESTRIL) 2.5 MG tablet Take 2.5 mg by mouth daily.     Allergies:   Bee venom   Past Medical History:  Diagnosis Date   Acute systolic heart failure (HCC)    exacerbation   Cardiomyopathy    nonischemic ejection fraction 20% by cardiac cath   High cholesterol    Hypertension     Past Surgical History:  Procedure Laterality Date   bilaterial pleural effusion     BLADDER SURGERY     trauma as a child during martial arts training     Social History:  The patient  reports that he has never smoked. He has never used smokeless tobacco. He reports current alcohol use. He reports that he does not currently use drugs.   Family History:  The patient's family history includes Heart attack in his mother; Heart disease in his sister; Hypertension in his mother.    ROS:  Please see the history of present illness. All other systems are  reviewed and  Negative to the above problem except as noted.    PHYSICAL EXAM: VS:  BP (!) 150/96   Pulse 90   Ht 5\' 9"  (1.753 m)   Wt 213 lb (96.6 kg)   BMI 31.45 kg/m   GEN: Well nourished, well developed, in no acute distress  HEENT: normal  Neck: no JVD, carotid bruits, Cardiac: RRR; no murmurs, No LE edema  Respiratory:  clear to auscultation bilaterally,  GI: soft, nontender, nondistended, + BS  No hepatomegaly  MS: no deformity Moving all extremities   Skin: warm and dry, no rash Neuro:  Strength and sensation are intact Psych: euthymic mood, full affect   EKG:  EKG is ordered today.   Lipid Panel    Component Value Date/Time   CHOL 139 08/06/2020 1259   TRIG 381 (H) 08/06/2020 1259   HDL 35 (L) 08/06/2020 1259   CHOLHDL 4.0 08/06/2020 1259   CHOLHDL 5.2 (H) 05/01/2016 1224   VLDL NOT CALC 05/01/2016 1224   LDLCALC 47 08/06/2020 1259  Wt Readings from Last 3 Encounters:  12/30/20 213 lb (96.6 kg)  09/07/20 200 lb (90.7 kg)  08/06/20 206 lb (93.4 kg)      ASSESSMENT AND PLAN:  1  Chest pain    Episode this summer   Atypical for cardiac   No recurrence   Follow for now  2  HTN  BP is not well controlled    I would recomm increasing lisinopril to 10 mg per day   Encouraged him to take BP at thome   He has follow up in IM  this winter    He will follow up in cardiology in 6 months    3  NICM   Echo in 2015 mild LV dysfunction   VOlume status is good   Keep on same meds for now  4   HL   On statin      Current medicines are reviewed at length with the patient today.  The patient does not have concerns regarding medicines.  Signed, Dietrich Pates, MD  12/30/2020 1:58 PM    [facility] [address] [place], [place], [place]  [address] Phone: [phone]; Fax: [phone]

## 2020-12-30 NOTE — Patient Instructions (Signed)
Medication Instructions:   Increase Lisinopril to 10 mg Daily   *If you need a refill on your cardiac medications before your next appointment, please call your pharmacy*   Lab Work: NONE   If you have labs (blood work) drawn today and your tests are completely normal, you will receive your results only by: MyChart Message (if you have MyChart) OR A paper copy in the mail If you have any lab test that is abnormal or we need to change your treatment, we will call you to review the results.   Testing/Procedures: NONE    Follow-Up: At [facility], you and your health needs are our priority.  As part of our continuing mission to provide you with exceptional heart care, we have created designated Provider Care Teams.  These Care Teams include your primary Cardiologist (physician) and Advanced Practice Providers (APPs -  Physician Assistants and Nurse Practitioners) who all work together to provide you with the care you need, when you need it.  We recommend signing up for the patient portal called "MyChart".  Sign up information is provided on this After Visit Summary.  MyChart is used to connect with patients for Virtual Visits (Telemedicine).  Patients are able to view lab/test results, encounter notes, upcoming appointments, etc.  Non-urgent messages can be sent to your provider as well.   To learn more about what you can do with MyChart, go to [email or web address].    Your next appointment:   6 month(s)  The format for your next appointment:   In Person  Provider:   Dietrich Pates, MD   Other Instructions Thank you for choosing [facility]!

## 2021-02-10 ENCOUNTER — Telehealth: Payer: Self-pay | Admitting: Internal Medicine

## 2021-02-10 MED ORDER — LISINOPRIL 10 MG PO TABS: 10.0000 mg | ORAL_TABLET | Freq: Every day | ORAL | 3 refills | Status: DC

## 2021-02-10 NOTE — Telephone Encounter (Signed)
  Pt c/o medication issue:  1. Name of Medication: lisinopril  2. How are you currently taking this medication (dosage and times per day)?   3. Are you having a reaction (difficulty breathing--STAT)?   4. What is your medication issue? Pharmacy calling to f/u refill for pt's lisinopril, however, pt said Dr. Tenny Craw increased his dosage to 10 mg  a day

## 2021-02-10 NOTE — Telephone Encounter (Signed)
REFILL DONE AS REQUESTED .Bryan Clark

## 2021-11-30 NOTE — Progress Notes (Signed)
Office Visit    Patient Name: Bryan Clark Date of Encounter: 12/01/2021  Primary Care Provider:  [facility] Primary Cardiologist:  None Primary Electrophysiologist: None  Chief Complaint    Bryan Clark is a 51 y.o. male with PMH of nonischemic cardiomyopathy, HTN, HLD who presents today for follow-up of cardiomyopathy.  Past Medical History    Past Medical History:  Diagnosis Date   Acute systolic heart failure (HCC)    exacerbation   Cardiomyopathy    nonischemic ejection fraction 20% by cardiac cath   High cholesterol    Hypertension    Past Surgical History:  Procedure Laterality Date   bilaterial pleural effusion     BLADDER SURGERY     trauma as a child during martial arts training    Allergies  Allergies  Allergen Reactions   Bee Venom Anaphylaxis    History of Present Illness    Bryan Clark  is a 51 year old male with the above mention past medical history who presents today for follow-up of NICM.  Bryan Clark was initially seen following ED admission for hypertension and chest discomfort in 2012.  He was also noted to have increased dyspnea on exertion CT of the chest revealed bilateral pleural effusions.  Chest x-ray was also completed that showed enlarged silhouette of the heart.  EKG showed poor R wave progression and LVH.  He was treated with IV Lasix and had good diuresis.  2D echo was completed and revealed EF of 35%, patient was then transferred to [facility] for left heart cath that revealed EF of 20% with normal coronaries.  He was treated with medications for hypertensive urgency and was discharged with structured to follow-up with cardiology.  During follow-up patient had repeat 2D echo that showed resolution of LV dysfunction with normal LV size.  He was seen by Dr. Tenny Craw on 07/2011 for follow-up and reported running out of medications and was advised to reach out for assistance.  His most recent 2D echo was completed  with EF of 45-50%, no RWMA, grade 1 DD with normal valve function.  He was last seen by Dr. Tenny Craw on 12/2020 for complaint of chest tightness.  Patient reported chest tightness following eating pizza but states it resolved spontaneously.  His blood pressure was not well controlled and lisinopril was increased to 10 mg and patient was encouraged to take blood pressures at home.  Patient had normal volume on examination.  Bryan Clark presents today for annual follow-up with his wife.  Since last being seen in the office patient reports he has been doing well with no new cardiac complaints.  He is tolerating his current medications without any adverse reactions.  He is euvolemic on examination today and reports no indiscretions with salt.  He is continuing to stay active with swimming and martial arts.  He reports no episodes of chest discomfort with activity.  Patient denies chest pain, palpitations, dyspnea, PND, orthopnea, nausea, vomiting, dizziness, syncope, edema, weight gain, or early satiety.  Home Medications    Current Outpatient Medications  Medication Sig Dispense Refill   aspirin EC 81 MG tablet Take 81 mg by mouth in the morning and at bedtime.     atorvastatin (LIPITOR) 40 MG tablet Take 1 tablet (40 mg total) by mouth daily. 30 tablet 3   carvedilol (COREG) 12.5 MG tablet TAKE 1 AND 1/2 TABLETS BY MOUTH TWICE DAILY WITH MEALS. 90 tablet 0   Empagliflozin-Linagliptin 25-5  MG TABS Take 25 mg by mouth daily.     EPINEPHrine (EPIPEN 2-PAK) 0.3 mg/0.3 mL IJ SOAJ injection Inject 0.3 mLs (0.3 mg total) into the muscle as needed for anaphylaxis (As need for bee stings). 1 each 1   furosemide (LASIX) 20 MG tablet Take 1 tablet (20 mg total) by mouth daily. 30 tablet 0   lisinopril (ZESTRIL) 10 MG tablet Take 1 tablet (10 mg total) by mouth daily. 90 tablet 3   metFORMIN (GLUCOPHAGE) 500 MG tablet Take 1 tablet (500 mg total) by mouth daily. (Patient taking differently: Take 500 mg by mouth 2 (two)  times daily with a meal.) 30 tablet 0   Multiple Vitamin (MULTIVITAMIN WITH MINERALS) TABS tablet Take 1 tablet by mouth daily.     Omega-3 Fatty Acids (FISH OIL) 500 MG CAPS Take 500 mg by mouth.     colesevelam (WELCHOL) 625 MG tablet Take 1,875 mg by mouth 2 (two) times daily.     nitroGLYCERIN (NITROSTAT) 0.4 MG SL tablet Place 0.4 mg under the tongue as needed. (Patient not taking: Reported on 12/01/2021)     terbinafine (LAMISIL) 250 MG tablet Take 250 mg by mouth daily.     No current facility-administered medications for this visit.     Review of Systems  Please see the history of present illness.    All other systems reviewed and are otherwise negative except as noted above.  Physical Exam    Wt Readings from Last 3 Encounters:  12/01/21 201 lb (91.2 kg)  12/30/20 213 lb (96.6 kg)  09/07/20 200 lb (90.7 kg)   VS: Vitals:   12/01/21 1028  BP: 128/88  Pulse: 84  SpO2: 92%  ,Body mass index is 29.68 kg/m.  Constitutional:      Appearance: Healthy appearance. Not in distress.  Neck:     Vascular: JVD normal.  Pulmonary:     Effort: Pulmonary effort is normal.     Breath sounds: No wheezing. No rales. Diminished in the bases Cardiovascular:     Normal rate. Regular rhythm. Normal S1. Normal S2.      Murmurs: There is no murmur.  Edema:    Peripheral edema absent.  Abdominal:     Palpations: Abdomen is soft non tender. There is no hepatomegaly.  Skin:    General: Skin is warm and dry.  Neurological:     General: No focal deficit present.     Mental Status: Alert and oriented to person, place and time.     Cranial Nerves: Cranial nerves are intact.  EKG/LABS/Other Studies Reviewed    ECG personally reviewed by me today -sinus rhythm with incomplete LBBB and rate of 84 with no acute changes   Lab Results  Component Value Date   WBC 8.0 09/07/2020   HGB 16.9 09/07/2020   HCT 50.4 09/07/2020   MCV 91.6 09/07/2020   PLT 152 09/07/2020   Lab Results   Component Value Date   CREATININE 1.03 09/07/2020   BUN 13 09/07/2020   NA 138 09/07/2020   K 3.9 09/07/2020   CL 105 09/07/2020   CO2 22 09/07/2020   Lab Results  Component Value Date   ALT 31 05/01/2016   AST 21 05/01/2016   ALKPHOS 60 05/01/2016   BILITOT 0.4 05/01/2016   Lab Results  Component Value Date   CHOL 139 08/06/2020   HDL 35 (L) 08/06/2020   LDLCALC 47 08/06/2020   TRIG 381 (H) 08/06/2020   CHOLHDL 4.0 08/06/2020  Lab Results  Component Value Date   HGBA1C 8.2 (H) 05/01/2016    Assessment & Plan    1.  NICM: -Last 2D echo was performed 04/2013 showing EF of 45-50% and no valvular abnormalities -We will plan to repeat 2D echo in 6 months -Today patient is euvolemic and has no reports of shortness of breath -We will maximize GDMT and add spironolactone 12.5 mg daily -Continue GDMT with carvedilol 12.5 mg twice daily, lisinopril 10 mg daily, and Lasix 20 mg daily -Low sodium diet, fluid restriction <2L, and daily weights encouraged. Educated to contact our office for weight gain of 2 lbs overnight or 5 lbs in one week.  -  2.  Nonobstructive CAD: -Patient had LHC performed 2012 that showed normal coronaries -Continue GDMT with Lipitor 40 mg and ASA 81 mg -Patient advised to continue current physical activity and low-sodium heart healthy diet   3.  HTN: -Blood pressure today was well controlled at 128/88 -Continue lisinopril 10 mg and carvedilol 12.5 mg twice daily  4.  HLD: -Patient's last LDL cholesterol was 52 -Continue current treatment plan with Lipitor 40 mg  5.  DM type II: -Patient's last hemoglobin A1c was 5.3 -Continue empagliflozin-linagliptin 25 5 and metformin 500 mg daily  Disposition: Follow-up with None or APP in 6 months    Medication Adjustments/Labs and Tests Ordered: Current medicines are reviewed at length with the patient today.  Concerns regarding medicines are outlined above.   Signed, Mable Fill, Marissa Nestle,  NP 12/01/2021, 10:48 AM [facility]  Note:  This document was prepared using Dragon voice recognition software and may include unintentional dictation errors.

## 2021-12-01 ENCOUNTER — Ambulatory Visit: Payer: Managed Care, Other (non HMO) | Attending: Internal Medicine | Admitting: Nurse Practitioner

## 2021-12-01 ENCOUNTER — Encounter: Payer: Self-pay | Admitting: Nurse Practitioner

## 2021-12-01 VITALS — BP 128/88 | HR 84 | Ht 69.0 in | Wt 201.0 lb

## 2021-12-01 DIAGNOSIS — E119 Type 2 diabetes mellitus without complications: Secondary | ICD-10-CM | POA: Diagnosis not present

## 2021-12-01 DIAGNOSIS — Z79899 Other long term (current) drug therapy: Secondary | ICD-10-CM

## 2021-12-01 DIAGNOSIS — I428 Other cardiomyopathies: Secondary | ICD-10-CM | POA: Diagnosis not present

## 2021-12-01 DIAGNOSIS — I10 Essential (primary) hypertension: Secondary | ICD-10-CM | POA: Diagnosis not present

## 2021-12-01 MED ORDER — SPIRONOLACTONE 25 MG PO TABS: 12.5000 mg | ORAL_TABLET | Freq: Every day | ORAL | 3 refills | Status: DC

## 2021-12-01 NOTE — Patient Instructions (Signed)
Medication Instructions:   START Aldactone one half  (0.5 ) tablet by mouth ( 12.5 mg ) daily  *If you need a refill on your cardiac medications before your next appointment, please call your pharmacy*   Lab Work:  Your physician recommends that you return for lab work one Thursday, October 12. You can come in on the day of your appointment anytime between 7:30-4:30.   If you have labs (blood work) drawn today and your tests are completely normal, you will receive your results only by: [place] (if you have MyChart) OR A paper copy in the mail If you have any lab test that is abnormal or we need to change your treatment, we will call you to review the results.   Testing/Procedures:  None ordered.   Follow-Up: At [facility], you and your health needs are our priority.  As part of our continuing mission to provide you with exceptional heart care, we have created designated Provider Care Teams.  These Care Teams include your primary Cardiologist (physician) and Advanced Practice Providers (APPs -  Physician Assistants and Nurse Practitioners) who all work together to provide you with the care you need, when you need it.  We recommend signing up for the patient portal called "MyChart".  Sign up information is provided on this After Visit Summary.  MyChart is used to connect with patients for Virtual Visits (Telemedicine).  Patients are able to view lab/test results, encounter notes, upcoming appointments, etc.  Non-urgent messages can be sent to your provider as well.   To learn more about what you can do with MyChart, go to [email or web address].    Your next appointment:   6 month(s)  The format for your next appointment:   In Person  Provider:   Dr. Dorris Carnes    Important Information About Sugar

## 2021-12-18 ENCOUNTER — Ambulatory Visit: Payer: Managed Care, Other (non HMO) | Attending: Nurse Practitioner

## 2021-12-18 DIAGNOSIS — I428 Other cardiomyopathies: Secondary | ICD-10-CM

## 2021-12-18 DIAGNOSIS — Z79899 Other long term (current) drug therapy: Secondary | ICD-10-CM

## 2021-12-18 LAB — BASIC METABOLIC PANEL
BUN/Creatinine Ratio: 14 (ref 9–20)
BUN: 14 mg/dL (ref 6–24)
CO2: 22 mmol/L (ref 20–29)
Calcium: 10.1 mg/dL (ref 8.7–10.2)
Chloride: 107 mmol/L — ABNORMAL HIGH (ref 96–106)
Creatinine, Ser: 1.01 mg/dL (ref 0.76–1.27)
Glucose: 143 mg/dL — ABNORMAL HIGH (ref 70–99)
Potassium: 4.3 mmol/L (ref 3.5–5.2)
Sodium: 142 mmol/L (ref 134–144)
eGFR: 91 mL/min/{1.73_m2} (ref >59)

## 2022-01-02 ENCOUNTER — Other Ambulatory Visit: Payer: Self-pay | Admitting: Internal Medicine

## 2022-01-05 ENCOUNTER — Telehealth: Payer: Self-pay | Admitting: Nurse Practitioner

## 2022-01-05 NOTE — Telephone Encounter (Signed)
I have advised the pts wife that I will need to speak or have permission from the pt to give her his results since I cannot see a DPR on file... she says he is in court all day and cannot call us back... I have advised her that I will send his results and recommendations to his My Chart for his review and he can let us know that he got the message and also let us know then if okay to talk to her in the future.

## 2022-01-05 NOTE — Telephone Encounter (Signed)
Pt spouse calling back for lab results,

## 2022-01-08 ENCOUNTER — Telehealth: Payer: Self-pay

## 2022-01-08 ENCOUNTER — Telehealth: Payer: Self-pay | Admitting: Nurse Practitioner

## 2022-01-08 DIAGNOSIS — I10 Essential (primary) hypertension: Secondary | ICD-10-CM

## 2022-01-08 DIAGNOSIS — I428 Other cardiomyopathies: Secondary | ICD-10-CM

## 2022-01-08 MED ORDER — SPIRONOLACTONE 25 MG PO TABS: 25.0000 mg | ORAL_TABLET | Freq: Every day | ORAL | 3 refills | Status: DC

## 2022-01-08 NOTE — Telephone Encounter (Signed)
Reviewed notes and instructions from Ambrose Pancoast, NP with patient's wife, Lynelle Smoke.  Patient has not been checking his BP, wife states they will start checking daily. He will increase spirolactone to 25 mg daily. Patient will get a BMET in 2 weeks at lab in [place].   No further questions at this time.

## 2022-01-08 NOTE — Telephone Encounter (Signed)
   Pre-operative Risk Assessment    Patient Name: Bryan Clark  DOB: [birth year] MRN: [id number]     Request for Surgical Clearance    Procedure:  Colonoscopy   Date of Surgery:  Clearance TBD                                 Surgeon:  Dr. Otis Brace Surgeon's Group or Practice Name:  [facility] Phone number:  [phone] Fax number:  [phone]   Type of Clearance Requested:   - Medical    Type of Anesthesia:  Propofol    Additional requests/questions:  Can patient have repeat echocardiogram before 6 months.   Signed, Mendel Ryder   01/08/2022, 3:53 PM

## 2022-01-08 NOTE — Telephone Encounter (Signed)
-----   Message from Marylu Lund., NP sent at 12/19/2021  7:37 AM EDT ----- Renal function and electrolytes are both within normal limits.  If patient's blood pressures have been stable we can increase his spironolactone to 25 mg daily on 12/29/21. Please recheck BMET two weeks after increase in dose.  Please let me know if you have any additional questions.  Ambrose Pancoast, NP

## 2022-01-08 NOTE — Telephone Encounter (Signed)
Follow Up:    Wife says she is returning a call from today. Not quite sure, who the nurse was

## 2022-01-09 DIAGNOSIS — Z01810 Encounter for preprocedural cardiovascular examination: Secondary | ICD-10-CM

## 2022-01-09 NOTE — Telephone Encounter (Signed)
Patient is scheduled for a echocardiogram on 01/26/22

## 2022-01-23 NOTE — Telephone Encounter (Signed)
This encounter was created in error - please disregard.

## 2022-01-26 ENCOUNTER — Ambulatory Visit (HOSPITAL_COMMUNITY): Payer: Managed Care, Other (non HMO) | Attending: Cardiology

## 2022-01-26 DIAGNOSIS — Z01810 Encounter for preprocedural cardiovascular examination: Secondary | ICD-10-CM | POA: Insufficient documentation

## 2022-01-26 DIAGNOSIS — I5030 Unspecified diastolic (congestive) heart failure: Secondary | ICD-10-CM

## 2022-01-26 LAB — ECHOCARDIOGRAM COMPLETE
Area-P 1/2: 6.32 cm2
S' Lateral: 4.57 cm

## 2022-01-27 ENCOUNTER — Other Ambulatory Visit: Payer: Self-pay

## 2022-01-27 DIAGNOSIS — I428 Other cardiomyopathies: Secondary | ICD-10-CM

## 2022-01-28 NOTE — Telephone Encounter (Signed)
   Patient Name: Bryan Clark  DOB: [birth year] MRN: [id number]  Primary Cardiologist: None  Chart reviewed as part of pre-operative protocol coverage. Patient was recently seen in the office by Robin Searing, NP, in 11/2021 and was doing well from a cardiac standpoint. 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 This was done on 01/26/2022 and showed LVEF of 45% with no regional wall motion abnormalities and grade 1 diastolic dysfunction and no significant valvular disease. EF previous 45-50% in 2015. I called patient today for additional pre-op evaluation and he reports no changes since last visit. He continues to do well from a cardiac standpoint with no chest pain, shortness of breath, orthopnea, PND, palpitations, lightheadedness, dizziness, syncope. He is able to complete >4.0 METS of physical activity. Given past medical history and time since last visit, based on ACC/AHA guidelines, MOREY ANDONIAN is at acceptable risk for the planned procedure without further cardiovascular testing.   I will route this recommendation to the requesting party via Epic fax function and remove from pre-op pool.  Please call with questions.  Corrin Parker, PA-C 01/28/2022, 1:15 PM

## 2022-01-28 NOTE — Telephone Encounter (Signed)
Pt is requesting call back to discuss what else he may need done before colonoscopy. Please advise.

## 2022-02-10 ENCOUNTER — Ambulatory Visit: Payer: Managed Care, Other (non HMO) | Admitting: Internal Medicine

## 2022-05-20 ENCOUNTER — Ambulatory Visit: Payer: Managed Care, Other (non HMO) | Admitting: Internal Medicine

## 2022-05-23 NOTE — Progress Notes (Unsigned)
Cardiology Office Note:    Date:  05/25/2022   ID:  Bryan Clark, DOB 1970-03-28, MRN [id number]  PCP:  [facility] [facility] Cardiologist:  Dorris Carnes, MD     Referring MD: [facility]   Chief Complaint: follow-up NICM  History of Present Illness:    Bryan Clark is a pleasant 52 y.o. male with a hx of nonischemic cardiomyopathy, HTN, HLD, and LBBB.   Initially seen following ED admission for hypertension and chest discomfort in 2012.  He was also noted increased dyspnea on exertion with CT of chest that revealed bilateral pleural effusions.  CXR was also completed that showed enlarged silhouette of the heart.  EKG showed poor R wave progression and LVH.  He was treated with IV Lasix and had good diuresis.  2D echo was completed and revealed EF of 35%.  He was transferred to [facility] for left heart cath that revealed EF of 20% with normal coronaries.  He was treated with medications for hypertensive urgency and discharged.  At follow-up with cardiology repeat 2D echo showed resolution of LV dysfunction with normal LV size. Seen by Dr. Harrington Challenger on 07/2011 and reported he was running out of his medications and needed assistance.  He was maintained on lisinopril, Lasix, carvedilol and maintained consistent follow-up until 2018. He was lost in follow-up until 12/2020 at which time he reported he was walking 6 miles per day and helping out at a martial arts studio.  He did not appear volume overloaded, but BP was elevated.  He was advised to increase lisinopril.  Last cardiology clinic visit was 12/01/2021 with Ambrose Pancoast, NP at which time he reported no new cardiac complaints.  He was euvolemic on exam continuing to stay active with swimming and martial arts.  Spironolactone 12.5 mg daily was added to maximize GDMT for CHF.  He was advised to have repeat echocardiogram in 6 months with follow-up afterwards. Following lab results on 12/18/21,  he was advised to increase spironolactone to 25 mg daily if BP was stable and to have repeat BMET 2 weeks later.  2D echo 01/26/2022 revealed LVEF 45%, no RWMA, G1 DD, normal RV and no significant valve disease.   He was given cardiac clearance for colonoscopy on 01/08/2022. He had good results.   Today, he is here for follow-up. Reports he is feeling well. BP at home 137 over low 80s. Did some martial arts training, is getting requests to train people. Wants to make certain he is cleared to do so from heart perspective. Walks 3 miles daily as well as does his own martial arts routine. He denies chest pain, shortness of breath, lower extremity edema, fatigue, palpitations, melena, hematuria, hemoptysis, diaphoresis, weakness, presyncope, syncope, orthopnea, and PND. No specific cardiac symptoms and no concerns with medications.   Past Medical History:  Diagnosis Date   Acute systolic heart failure (HCC)    exacerbation   Cardiomyopathy    nonischemic ejection fraction 20% by cardiac cath   High cholesterol    Hypertension     Past Surgical History:  Procedure Laterality Date   bilaterial pleural effusion     BLADDER SURGERY     trauma as a child during martial arts training    Current Medications: Current Meds  Medication Sig   aspirin EC 81 MG tablet Take 81 mg by mouth in the morning and at bedtime.   atorvastatin (LIPITOR) 40 MG tablet Take 1  tablet (40 mg total) by mouth daily.   carvedilol (COREG) 12.5 MG tablet TAKE 1 AND 1/2 TABLETS BY MOUTH TWICE DAILY WITH MEALS.   colesevelam (WELCHOL) 625 MG tablet Take 1,875 mg by mouth 2 (two) times daily.   Empagliflozin-Linagliptin 25-5 MG TABS Take 25 mg by mouth daily.   EPINEPHrine (EPIPEN 2-PAK) 0.3 mg/0.3 mL IJ SOAJ injection Inject 0.3 mLs (0.3 mg total) into the muscle as needed for anaphylaxis (As need for bee stings).   furosemide (LASIX) 20 MG tablet Take 1 tablet (20 mg total) by mouth daily.   lisinopril (ZESTRIL) 10 MG  tablet TAKE ONE TABLET BY MOUTH ONCE DAILY.   metFORMIN (GLUCOPHAGE-XR) 500 MG 24 hr tablet Take 1,000 mg by mouth 2 (two) times daily.   Multiple Vitamin (MULTIVITAMIN WITH MINERALS) TABS tablet Take 1 tablet by mouth daily.   nitroGLYCERIN (NITROSTAT) 0.4 MG SL tablet Place 0.4 mg under the tongue as needed.   Omega-3 Fatty Acids (FISH OIL) 500 MG CAPS Take 500 mg by mouth.   spironolactone (ALDACTONE) 25 MG tablet Take 1 tablet (25 mg total) by mouth daily.   terbinafine (LAMISIL) 250 MG tablet Take 250 mg by mouth daily.   [DISCONTINUED] metFORMIN (GLUCOPHAGE) 500 MG tablet Take 1 tablet (500 mg total) by mouth daily. (Patient taking differently: Take 500 mg by mouth 2 (two) times daily with a meal.)     Allergies:   Bee venom   Social History   Socioeconomic History   Marital status: Married    Spouse name: Not on file   Number of children: Not on file   Years of education: Not on file   Highest education level: Not on file  Occupational History   Occupation: full time    Comment: security guard  Tobacco Use   Smoking status: Never   Smokeless tobacco: Never  Substance and Sexual Activity   Alcohol use: Yes    Comment: occasionally   Drug use: Not Currently   Sexual activity: Not on file  Other Topics Concern   Not on file  Social History Narrative   Not on file   Social Determinants of Health   Financial Resource Strain: Not on file  Food Insecurity: Not on file  Transportation Needs: Not on file  Physical Activity: Not on file  Stress: Not on file  Social Connections: Not on file     Family History: The patient's family history includes Heart attack in his mother; Heart disease in his sister; Hypertension in his mother.  ROS:   Please see the history of present illness.   All other systems reviewed and are negative.  Labs/Other Studies Reviewed:    The following studies were reviewed today:  Echo 01/26/22 1. Left ventricular ejection fraction by 3D  volume is 45 %. The left  ventricle has mildly decreased function. The left ventricle has no  regional wall motion abnormalities. Left ventricular diastolic parameters  are consistent with Grade I diastolic  dysfunction (impaired relaxation).   2. Right ventricular systolic function is normal. The right ventricular  size is normal.   3. The mitral valve is normal in structure. No evidence of mitral valve  regurgitation. No evidence of mitral stenosis.   4. The aortic valve is normal in structure. Aortic valve regurgitation is  trivial. No aortic stenosis is present.   5. The inferior vena cava is normal in size with greater than 50%  respiratory variability, suggesting right atrial pressure of 3 mmHg.  Comparison(s): 04/19/13 EF 45-50%.  Recent Labs: 12/18/2021: BUN 14; Creatinine, Ser 1.01; Potassium 4.3; Sodium 142  Recent Lipid Panel    Component Value Date/Time   CHOL 139 08/06/2020 1259   TRIG 381 (H) 08/06/2020 1259   HDL 35 (L) 08/06/2020 1259   CHOLHDL 4.0 08/06/2020 1259   CHOLHDL 5.2 (H) 05/01/2016 1224   VLDL NOT CALC 05/01/2016 1224   LDLCALC 47 08/06/2020 1259     Risk Assessment/Calculations:       Physical Exam:    VS:  BP 136/78   Pulse 74   Ht 5\' 9"  (1.753 m)   Wt 203 lb 12.8 oz (92.4 kg)   SpO2 96%   BMI 30.10 kg/m     Wt Readings from Last 3 Encounters:  05/25/22 203 lb 12.8 oz (92.4 kg)  12/01/21 201 lb (91.2 kg)  12/30/20 213 lb (96.6 kg)     GEN:  Well nourished, well developed in no acute distress HEENT: Normal NECK: No JVD; No carotid bruits CARDIAC: RRR, no murmurs, rubs, gallops RESPIRATORY:  Clear to auscultation without rales, wheezing or rhonchi  ABDOMEN: Soft, non-tender, non-distended MUSCULOSKELETAL:  No edema; No deformity. 2+ pedal pulses, equal bilaterally SKIN: Warm and dry NEUROLOGIC:  Alert and oriented x 3 PSYCHIATRIC:  Normal affect   EKG:  EKG is not ordered today.    Diagnoses:    1. Nonischemic cardiomyopathy  ([place])   2. Chronic HFrEF (heart failure with reduced ejection fraction) ([place])   3. Coronary artery disease involving native coronary artery of native heart without angina pectoris   4. Essential hypertension   5. Hyperlipidemia LDL goal <70   6. Type 2 diabetes mellitus without complication, without long-term current use of insulin (HCC)    Assessment and Plan:     NICM/Chronic HFrEF: LV 45%, G1 DD, normal RV on echo 01/26/2022. He appears euvolemic on exam. He denies dyspnea, orthopnea, PND, edema.  No problems with medications.  Continue GDMT including carvedilol, empagliflozin, lisinopril, spironolactone. Renal function stable on lab results 01/2022.   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 He denies chest pain, dyspnea, or other symptoms concerning for angina.  No indication for further ischemic evaluation at this time. Is currently active with martial arts and walking 2 miles daily. Encouraged him to pursue martial arts training and if he has any concerning symptoms to notify us.  Secondary prevention emphasized including heart healthy, mostly plant-based diet, and 150 minutes of moderate intensity exercise each week.  Hypertension: BP initially elevated, improved on my recheck. Reports home BP is well controlled.   Hyperlipidemia LDL goal < 70: LDL 59 on 01/29/22.   Diabetes: Reports blood sugar has been well-controlled.  Encouraged him to incorporate more plant-based and whole food options in the place of refined carbohydrates.     Disposition: 6 months with Dr. Harrington Challenger  Medication Adjustments/Labs and Tests Ordered: Current medicines are reviewed at length with the patient today.  Concerns regarding medicines are outlined above.  No orders of the defined types were placed in this encounter.  No orders of the defined types were placed in this encounter.   Patient Instructions  Medication Instructions:   Your physician recommends that you continue on your  current medications as directed. Please refer to the Current Medication list given to you today.   *If you need a refill on your cardiac medications before your next appointment, please call your pharmacy*   Lab Work:  None ordered.  If  you have labs (blood work) drawn today and your tests are completely normal, you will receive your results only by: [place] (if you have MyChart) OR A paper copy in the mail If you have any lab test that is abnormal or we need to change your treatment, we will call you to review the results.   Testing/Procedures:  None ordered.   Follow-Up: At [facility], you and your health needs are our priority.  As part of our continuing mission to provide you with exceptional heart care, we have created designated Provider Care Teams.  These Care Teams include your primary Cardiologist (physician) and Advanced Practice Providers (APPs -  Physician Assistants and Nurse Practitioners) who all work together to provide you with the care you need, when you need it.  We recommend signing up for the patient portal called "MyChart".  Sign up information is provided on this After Visit Summary.  MyChart is used to connect with patients for Virtual Visits (Telemedicine).  Patients are able to view lab/test results, encounter notes, upcoming appointments, etc.  Non-urgent messages can be sent to your provider as well.   To learn more about what you can do with MyChart, go to [email or web address].    Your next appointment:   6 month(s)  Provider:   Dorris Carnes, MD     Other Instructions Mediterranean Diet A Mediterranean diet refers to food and lifestyle choices that are based on the traditions of countries located on the [place]. It focuses on eating more fruits, vegetables, whole grains, beans, nuts, seeds, and heart-healthy fats, and eating less dairy, meat, eggs, and processed foods with added sugar, salt, and fat. This way of eating has  been shown to help prevent certain conditions and improve outcomes for people who have chronic diseases, like kidney disease and heart disease. What are tips for following this plan? Reading food labels Check the serving size of packaged foods. For foods such as rice and pasta, the serving size refers to the amount of cooked product, not dry. Check the total fat in packaged foods. Avoid foods that have saturated fat or trans fats. Check the ingredient list for added sugars, such as corn syrup. Shopping  Buy a variety of foods that offer a balanced diet, including: Fresh fruits and vegetables (produce). Grains, beans, nuts, and seeds. Some of these may be available in unpackaged forms or large amounts (in bulk). Fresh seafood. Poultry and eggs. Low-fat dairy products. Buy whole ingredients instead of prepackaged foods. Buy fresh fruits and vegetables in-season from local farmers markets. Buy plain frozen fruits and vegetables. If you do not have access to quality fresh seafood, buy precooked frozen shrimp or canned fish, such as tuna, salmon, or sardines. Stock your pantry so you always have certain foods on hand, such as olive oil, canned tuna, canned tomatoes, rice, pasta, and beans. Cooking Cook foods with extra-virgin olive oil instead of using butter or other vegetable oils. Have meat as a side dish, and have vegetables or grains as your main dish. This means having meat in small portions or adding small amounts of meat to foods like pasta or stew. Use beans or vegetables instead of meat in common dishes like chili or lasagna. Experiment with different cooking methods. Try roasting, broiling, steaming, and sauting vegetables. Add frozen vegetables to soups, stews, pasta, or rice. Add nuts or seeds for added healthy fats and plant protein at each meal. You can add these to yogurt, salads, or vegetable dishes. Marinate  fish or vegetables using olive oil, lemon juice, garlic, and fresh  herbs. Meal planning Plan to eat one vegetarian meal one day each week. Try to work up to two vegetarian meals, if possible. Eat seafood two or more times a week. Have healthy snacks readily available, such as: Vegetable sticks with hummus. Greek yogurt. Fruit and nut trail mix. Eat balanced meals throughout the week. This includes: Fruit: 2-3 servings a day. Vegetables: 4-5 servings a day. Low-fat dairy: 2 servings a day. Fish, poultry, or lean meat: 1 serving a day. Beans and legumes: 2 or more servings a week. Nuts and seeds: 1-2 servings a day. Whole grains: 6-8 servings a day. Extra-virgin olive oil: 3-4 servings a day. Limit red meat and sweets to only a few servings a month. Lifestyle  Cook and eat meals together with your family, when possible. Drink enough fluid to keep your urine pale yellow. Be physically active every day. This includes: Aerobic exercise like running or swimming. Leisure activities like gardening, walking, or housework. Get 7-8 hours of sleep each night. If recommended by your health care provider, drink red wine in moderation. This means 1 glass a day for nonpregnant women and 2 glasses a day for men. A glass of wine equals 5 oz (150 mL). What foods should I eat? Fruits Apples. Apricots. Avocado. Berries. Bananas. Cherries. Dates. Figs. Grapes. Lemons. Melon. Oranges. Peaches. Plums. Pomegranate. Vegetables Artichokes. Beets. Broccoli. Cabbage. Carrots. Eggplant. Green beans. Chard. Kale. Spinach. Onions. Leeks. Peas. Squash. Tomatoes. Peppers. Radishes. Grains Whole-grain pasta. Brown rice. Bulgur wheat. Polenta. Couscous. Whole-wheat bread. [place]. Meats and other proteins Beans. Almonds. Sunflower seeds. Pine nuts. Peanuts. [place]. Salmon. Scallops. Shrimp. [place]. Tilapia. Clams. Oysters. Eggs. Poultry without skin. Dairy Low-fat milk. Cheese. Greek yogurt. Fats and oils Extra-virgin olive oil. Avocado oil. Grapeseed oil. Beverages Water.  Red wine. Herbal tea. Sweets and desserts Greek yogurt with honey. Baked apples. Poached pears. Trail mix. Seasonings and condiments Basil. Cilantro. Coriander. Cumin. Mint. Parsley. Sage. Rosemary. Tarragon. Garlic. Oregano. Thyme. Pepper. Balsamic vinegar. Tahini. Hummus. Tomato sauce. Olives. Mushrooms. The items listed above may not be a complete list of foods and beverages you can eat. Contact a dietitian for more information. What foods should I limit? This is a list of foods that should be eaten rarely or only on special occasions. Fruits Fruit canned in syrup. Vegetables Deep-fried potatoes (french fries). Grains Prepackaged pasta or rice dishes. Prepackaged cereal with added sugar. Prepackaged snacks with added sugar. Meats and other proteins Beef. Pork. Lamb. Poultry with skin. Hot dogs. [place] Salines. Dairy Ice cream. Sour cream. Whole milk. Fats and oils Butter. Canola oil. Vegetable oil. Beef fat (tallow). Lard. Beverages Juice. Sugar-sweetened soft drinks. Beer. Liquor and spirits. Sweets and desserts Cookies. Cakes. Pies. Candy. Seasonings and condiments Mayonnaise. Pre-made sauces and marinades. The items listed above may not be a complete list of foods and beverages you should limit. Contact a dietitian for more information. Summary The Mediterranean diet includes both food and lifestyle choices. Eat a variety of fresh fruits and vegetables, beans, nuts, seeds, and whole grains. Limit the amount of red meat and sweets that you eat. If recommended by your health care provider, drink red wine in moderation. This means 1 glass a day for nonpregnant women and 2 glasses a day for men. A glass of wine equals 5 oz (150 mL). This information is not intended to replace advice given to you by your health care provider. Make sure you discuss any questions you  have with your health care provider. Document Revised: 03/31/2019 Document Reviewed: 01/26/2019 Elsevier Patient Education   [place]. Adopting a Healthy Lifestyle.   Weight: Know what a healthy weight is for you (roughly BMI <25) and aim to maintain this. You can calculate your body mass index on your smart phone  Diet: Aim for 7+ servings of fruits and vegetables daily Limit animal fats in diet for cholesterol and heart health - choose grass fed whenever available Avoid highly processed foods (fast food burgers, tacos, fried chicken, pizza, hot dogs, french fries)  Saturated fat comes in the form of butter, lard, coconut oil, margarine, partially hydrogenated oils, and fat in meat. These increase your risk of cardiovascular disease.  Use healthy plant oils, such as olive, canola, soy, corn, sunflower and peanut.  Whole foods such as fruits, vegetables and whole grains have fiber  Men need > 38 grams of fiber per day Women need > 25 grams of fiber per day  Load up on vegetables and fruits - one-half of your plate: Aim for color and variety, and remember that potatoes dont count. Go for whole grains - one-quarter of your plate: Whole wheat, barley, wheat berries, quinoa, oats, brown rice, and foods made with them. If you want pasta, go with whole wheat pasta. Protein power - one-quarter of your plate: Fish, chicken, beans, and nuts are all healthy, versatile protein sources. Limit red meat. You need carbohydrates for energy! The type of carbohydrate is more important than the amount. Choose carbohydrates such as vegetables, fruits, whole grains, beans, and nuts in the place of white rice, white pasta, potatoes (baked or fried), macaroni and cheese, cakes, cookies, and donuts.  If youre thirsty, drink water. Coffee and tea are good in moderation, but skip sugary drinks and limit milk and dairy products to one or two daily servings. Keep sugar intake at 6 teaspoons or 24 grams or LESS       Exercise: Aim for 150 min of moderate intensity exercise weekly for heart health, and weights twice weekly for bone  health Stay active - any steps are better than no steps! Aim for 7-9 hours of sleep daily          Signed, Emmaline Life, NP  05/25/2022 1:36 PM    [place]

## 2022-05-25 ENCOUNTER — Encounter: Payer: Self-pay | Admitting: Nurse Practitioner

## 2022-05-25 ENCOUNTER — Ambulatory Visit: Payer: Managed Care, Other (non HMO) | Attending: Internal Medicine | Admitting: Nurse Practitioner

## 2022-05-25 VITALS — BP 136/78 | HR 74 | Ht 69.0 in | Wt 203.8 lb

## 2022-05-25 DIAGNOSIS — E119 Type 2 diabetes mellitus without complications: Secondary | ICD-10-CM

## 2022-05-25 DIAGNOSIS — I5022 Chronic systolic (congestive) heart failure: Secondary | ICD-10-CM

## 2022-05-25 DIAGNOSIS — E785 Hyperlipidemia, unspecified: Secondary | ICD-10-CM

## 2022-05-25 DIAGNOSIS — I2510 Atherosclerotic heart disease of native coronary artery without angina pectoris: Secondary | ICD-10-CM

## 2022-05-25 DIAGNOSIS — I428 Other cardiomyopathies: Secondary | ICD-10-CM | POA: Diagnosis not present

## 2022-05-25 DIAGNOSIS — I10 Essential (primary) hypertension: Secondary | ICD-10-CM

## 2022-05-25 NOTE — Patient Instructions (Signed)
Medication Instructions:   Your physician recommends that you continue on your current medications as directed. Please refer to the Current Medication list given to you today.   *If you need a refill on your cardiac medications before your next appointment, please call your pharmacy*   Lab Work:  None ordered.  If you have labs (blood work) drawn today and your tests are completely normal, you will receive your results only by: [place] (if you have MyChart) OR A paper copy in the mail If you have any lab test that is abnormal or we need to change your treatment, we will call you to review the results.   Testing/Procedures:  None ordered.   Follow-Up: At [facility], you and your health needs are our priority.  As part of our continuing mission to provide you with exceptional heart care, we have created designated Provider Care Teams.  These Care Teams include your primary Cardiologist (physician) and Advanced Practice Providers (APPs -  Physician Assistants and Nurse Practitioners) who all work together to provide you with the care you need, when you need it.  We recommend signing up for the patient portal called "MyChart".  Sign up information is provided on this After Visit Summary.  MyChart is used to connect with patients for Virtual Visits (Telemedicine).  Patients are able to view lab/test results, encounter notes, upcoming appointments, etc.  Non-urgent messages can be sent to your provider as well.   To learn more about what you can do with MyChart, go to [email or web address].    Your next appointment:   6 month(s)  Provider:   Dorris Carnes, MD     Other Instructions Mediterranean Diet A Mediterranean diet refers to food and lifestyle choices that are based on the traditions of countries located on the [place]. It focuses on eating more fruits, vegetables, whole grains, beans, nuts, seeds, and heart-healthy fats, and eating less dairy, meat,  eggs, and processed foods with added sugar, salt, and fat. This way of eating has been shown to help prevent certain conditions and improve outcomes for people who have chronic diseases, like kidney disease and heart disease. What are tips for following this plan? Reading food labels Check the serving size of packaged foods. For foods such as rice and pasta, the serving size refers to the amount of cooked product, not dry. Check the total fat in packaged foods. Avoid foods that have saturated fat or trans fats. Check the ingredient list for added sugars, such as corn syrup. Shopping  Buy a variety of foods that offer a balanced diet, including: Fresh fruits and vegetables (produce). Grains, beans, nuts, and seeds. Some of these may be available in unpackaged forms or large amounts (in bulk). Fresh seafood. Poultry and eggs. Low-fat dairy products. Buy whole ingredients instead of prepackaged foods. Buy fresh fruits and vegetables in-season from local farmers markets. Buy plain frozen fruits and vegetables. If you do not have access to quality fresh seafood, buy precooked frozen shrimp or canned fish, such as tuna, salmon, or sardines. Stock your pantry so you always have certain foods on hand, such as olive oil, canned tuna, canned tomatoes, rice, pasta, and beans. Cooking Cook foods with extra-virgin olive oil instead of using butter or other vegetable oils. Have meat as a side dish, and have vegetables or grains as your main dish. This means having meat in small portions or adding small amounts of meat to foods like pasta or stew. Use beans or vegetables instead  of meat in common dishes like chili or lasagna. Experiment with different cooking methods. Try roasting, broiling, steaming, and sauting vegetables. Add frozen vegetables to soups, stews, pasta, or rice. Add nuts or seeds for added healthy fats and plant protein at each meal. You can add these to yogurt, salads, or vegetable  dishes. Marinate fish or vegetables using olive oil, lemon juice, garlic, and fresh herbs. Meal planning Plan to eat one vegetarian meal one day each week. Try to work up to two vegetarian meals, if possible. Eat seafood two or more times a week. Have healthy snacks readily available, such as: Vegetable sticks with hummus. Greek yogurt. Fruit and nut trail mix. Eat balanced meals throughout the week. This includes: Fruit: 2-3 servings a day. Vegetables: 4-5 servings a day. Low-fat dairy: 2 servings a day. Fish, poultry, or lean meat: 1 serving a day. Beans and legumes: 2 or more servings a week. Nuts and seeds: 1-2 servings a day. Whole grains: 6-8 servings a day. Extra-virgin olive oil: 3-4 servings a day. Limit red meat and sweets to only a few servings a month. Lifestyle  Cook and eat meals together with your family, when possible. Drink enough fluid to keep your urine pale yellow. Be physically active every day. This includes: Aerobic exercise like running or swimming. Leisure activities like gardening, walking, or housework. Get 7-8 hours of sleep each night. If recommended by your health care provider, drink red wine in moderation. This means 1 glass a day for nonpregnant women and 2 glasses a day for men. A glass of wine equals 5 oz (150 mL). What foods should I eat? Fruits Apples. Apricots. Avocado. Berries. Bananas. Cherries. Dates. Figs. Grapes. Lemons. Melon. Oranges. Peaches. Plums. Pomegranate. Vegetables Artichokes. Beets. Broccoli. Cabbage. Carrots. Eggplant. Green beans. Chard. Kale. Spinach. Onions. Leeks. Peas. Squash. Tomatoes. Peppers. Radishes. Grains Whole-grain pasta. Brown rice. Bulgur wheat. Polenta. Couscous. Whole-wheat bread. [place]. Meats and other proteins Beans. Almonds. Sunflower seeds. Pine nuts. Peanuts. [place]. Salmon. Scallops. Shrimp. [place]. Tilapia. Clams. Oysters. Eggs. Poultry without skin. Dairy Low-fat milk. Cheese. Greek  yogurt. Fats and oils Extra-virgin olive oil. Avocado oil. Grapeseed oil. Beverages Water. Red wine. Herbal tea. Sweets and desserts Greek yogurt with honey. Baked apples. Poached pears. Trail mix. Seasonings and condiments Basil. Cilantro. Coriander. Cumin. Mint. Parsley. Sage. Rosemary. Tarragon. Garlic. Oregano. Thyme. Pepper. Balsamic vinegar. Tahini. Hummus. Tomato sauce. Olives. Mushrooms. The items listed above may not be a complete list of foods and beverages you can eat. Contact a dietitian for more information. What foods should I limit? This is a list of foods that should be eaten rarely or only on special occasions. Fruits Fruit canned in syrup. Vegetables Deep-fried potatoes (french fries). Grains Prepackaged pasta or rice dishes. Prepackaged cereal with added sugar. Prepackaged snacks with added sugar. Meats and other proteins Beef. Pork. Lamb. Poultry with skin. Hot dogs. Berniece Salines. Dairy Ice cream. Sour cream. Whole milk. Fats and oils Butter. Canola oil. Vegetable oil. Beef fat (tallow). Lard. Beverages Juice. Sugar-sweetened soft drinks. Beer. Liquor and spirits. Sweets and desserts Cookies. Cakes. Pies. Candy. Seasonings and condiments Mayonnaise. Pre-made sauces and marinades. The items listed above may not be a complete list of foods and beverages you should limit. Contact a dietitian for more information. Summary The Mediterranean diet includes both food and lifestyle choices. Eat a variety of fresh fruits and vegetables, beans, nuts, seeds, and whole grains. Limit the amount of red meat and sweets that you eat. If recommended by your health  care provider, drink red wine in moderation. This means 1 glass a day for nonpregnant women and 2 glasses a day for men. A glass of wine equals 5 oz (150 mL). This information is not intended to replace advice given to you by your health care provider. Make sure you discuss any questions you have with your health care  provider. Document Revised: 03/31/2019 Document Reviewed: 01/26/2019 Elsevier Patient Education  [place]. Adopting a Healthy Lifestyle.   Weight: Know what a healthy weight is for you (roughly BMI <25) and aim to maintain this. You can calculate your body mass index on your smart phone  Diet: Aim for 7+ servings of fruits and vegetables daily Limit animal fats in diet for cholesterol and heart health - choose grass fed whenever available Avoid highly processed foods (fast food burgers, tacos, fried chicken, pizza, hot dogs, french fries)  Saturated fat comes in the form of butter, lard, coconut oil, margarine, partially hydrogenated oils, and fat in meat. These increase your risk of cardiovascular disease.  Use healthy plant oils, such as olive, canola, soy, corn, sunflower and peanut.  Whole foods such as fruits, vegetables and whole grains have fiber  Men need > 38 grams of fiber per day Women need > 25 grams of fiber per day  Load up on vegetables and fruits - one-half of your plate: Aim for color and variety, and remember that potatoes dont count. Go for whole grains - one-quarter of your plate: Whole wheat, barley, wheat berries, quinoa, oats, brown rice, and foods made with them. If you want pasta, go with whole wheat pasta. Protein power - one-quarter of your plate: Fish, chicken, beans, and nuts are all healthy, versatile protein sources. Limit red meat. You need carbohydrates for energy! The type of carbohydrate is more important than the amount. Choose carbohydrates such as vegetables, fruits, whole grains, beans, and nuts in the place of white rice, white pasta, potatoes (baked or fried), macaroni and cheese, cakes, cookies, and donuts.  If youre thirsty, drink water. Coffee and tea are good in moderation, but skip sugary drinks and limit milk and dairy products to one or two daily servings. Keep sugar intake at 6 teaspoons or 24 grams or LESS       Exercise: Aim for  150 min of moderate intensity exercise weekly for heart health, and weights twice weekly for bone health Stay active - any steps are better than no steps! Aim for 7-9 hours of sleep daily

## 2022-05-27 ENCOUNTER — Ambulatory Visit: Payer: Managed Care, Other (non HMO) | Admitting: Nurse Practitioner

## 2022-11-16 NOTE — Progress Notes (Unsigned)
Cardiology Office Note   Date:  11/16/2022   ID:  Bryan Clark, DOB [birth year], MRN [id number]  PCP:  [facility]  Cardiologist:   Dietrich Pates, MD   Patient referred back to cardiology for continued cardiac care and for evaluation of chest tightness    History of Present Illness: Bryan Clark is a 52 y.o. male with a history of  NICM, HTN and HL     I saw him in cardiology in 2013  Cardiac cath showed no CAD   Echo in 2015 LVEF 45 to 50%.   He was last seen in clinic in 2018 Continues to follow at [facility]      He was seen in July   ONe night eating pizza   Developped chest tightness   Squeezing sensation in chest      Seen in ER at [facility] After that spell he has not had any further spells of chest discomfort     The patient says is breathing is OK   He is able to walk 6 miles per day   NO CP   No signif SOB    Also helps at a martial arts studio     Breakfast    Skipds Lunch 12:30   Turkey/cheese sandwich Dinner  Spaghetti   Sushi Drinks:  Water or sun tea  I saw the pt in 2022   HE was last seen in cardiology earlier this year by Benjamine Sprague    No outpatient medications have been marked as taking for the 11/18/22 encounter (Appointment) with Pricilla Riffle, MD.     Allergies:   Bee venom   Past Medical History:  Diagnosis Date   Acute systolic heart failure (HCC)    exacerbation   Cardiomyopathy    nonischemic ejection fraction 20% by cardiac cath   High cholesterol    Hypertension     Past Surgical History:  Procedure Laterality Date   bilaterial pleural effusion     BLADDER SURGERY     trauma as a child during martial arts training     Social History:  The patient  reports that he has never smoked. He has never used smokeless tobacco. He reports current alcohol use. He reports that he does not currently use drugs.   Family History:  The patient's family history includes Heart attack in his mother; Heart disease in his sister;  Hypertension in his mother.    ROS:  Please see the history of present illness. All other systems are reviewed and  Negative to the above problem except as noted.    PHYSICAL EXAM: VS:  There were no vitals taken for this visit.  GEN: Well nourished, well developed, in no acute distress  HEENT: normal  Neck: no JVD, carotid bruits, Cardiac: RRR; no murmurs, No LE edema  Respiratory:  clear to auscultation bilaterally,  GI: soft, nontender, nondistended, + BS  No hepatomegaly  MS: no deformity Moving all extremities   Skin: warm and dry, no rash Neuro:  Strength and sensation are intact Psych: euthymic mood, full affect   EKG:  EKG is ordered today.   Lipid Panel    Component Value Date/Time   CHOL 139 08/06/2020 1259   TRIG 381 (H) 08/06/2020 1259   HDL 35 (L) 08/06/2020 1259   CHOLHDL 4.0 08/06/2020 1259   CHOLHDL 5.2 (H) 05/01/2016 1224   VLDL NOT CALC 05/01/2016 1224   LDLCALC 47 08/06/2020 1259  Wt Readings from Last 3 Encounters:  05/25/22 203 lb 12.8 oz (92.4 kg)  12/01/21 201 lb (91.2 kg)  12/30/20 213 lb (96.6 kg)      ASSESSMENT AND PLAN:  1  Chest pain    Episode this summer   Atypical for cardiac   No recurrence   Follow for now  2  HTN  BP is not well controlled    I would recomm increasing lisinopril to 10 mg per day   Encouraged him to take BP at thome   He has follow up in IM  this winter    He will follow up in cardiology in 6 months    3  NICM   Echo in 2015 mild LV dysfunction   VOlume status is good   Keep on same meds for now  4   HL   On statin      Current medicines are reviewed at length with the patient today.  The patient does not have concerns regarding medicines.  Signed, Dietrich Pates, MD  11/16/2022 9:03 PM    [facility] [address] [place], [place], [place]  [address] Phone: [phone]; Fax: [phone]

## 2022-11-18 ENCOUNTER — Encounter: Payer: Self-pay | Admitting: Internal Medicine

## 2022-11-18 ENCOUNTER — Ambulatory Visit: Payer: Managed Care, Other (non HMO) | Attending: Internal Medicine | Admitting: Internal Medicine

## 2022-11-18 VITALS — BP 120/70 | HR 91 | Ht 69.0 in | Wt 201.0 lb

## 2022-11-18 DIAGNOSIS — I5022 Chronic systolic (congestive) heart failure: Secondary | ICD-10-CM

## 2022-11-18 DIAGNOSIS — I428 Other cardiomyopathies: Secondary | ICD-10-CM | POA: Diagnosis not present

## 2022-11-18 DIAGNOSIS — I2510 Atherosclerotic heart disease of native coronary artery without angina pectoris: Secondary | ICD-10-CM

## 2022-11-18 NOTE — Patient Instructions (Signed)
Medication Instructions:  Your physician recommends that you continue on your current medications as directed. Please refer to the Current Medication list given to you today.  *If you need a refill on your cardiac medications before your next appointment, please call your pharmacy*  Follow-Up: At [facility], you and your health needs are our priority.  As part of our continuing mission to provide you with exceptional heart care, we have created designated Provider Care Teams.  These Care Teams include your primary Cardiologist (physician) and Advanced Practice Providers (APPs -  Physician Assistants and Nurse Practitioners) who all work together to provide you with the care you need, when you need it.  Your next appointment:   May 2025 at the [place] Office  Provider:   Dietrich Pates, MD

## 2022-11-27 ENCOUNTER — Telehealth: Payer: Self-pay | Admitting: Internal Medicine

## 2022-11-27 NOTE — Telephone Encounter (Signed)
Name: Bryan Clark  DOB: 1970-05-19  MRN: [id number]   Primary Cardiologist: Dietrich Pates, MD  Chart reviewed as part of pre-operative protocol coverage. Bryan Clark was last seen on 11/18/2022 by Dr. Tenny Craw.  Per office visit note "From a cardiac standpoint I think he is low risk for any procedures and ok to proceed without further testing."  Therefore, based on ACC/AHA guidelines, the patient would be at acceptable risk for the planned procedure without further cardiovascular testing.   I will route this recommendation to the requesting party via Epic fax function and remove from pre-op pool. Please call with questions.  Carlos Levering, NP 11/27/2022, 12:40 PM

## 2022-11-27 NOTE — Telephone Encounter (Signed)
Pre-operative Risk Assessment    Patient Name: Bryan Clark  DOB: 10/08/1970 MRN: [id number]      Request for Surgical Clearance    Procedure:   Wide local excision sentinel lymph node biopsy and skin graph  Date of Surgery:  Clearance TBD                                 Surgeon:  Dr. Lucretia Roers Surgeon's Group or Practice Name: [facility]  Phone number:  [phone]  Fax number:  [phone]   Type of Clearance Requested:   - Medical    Type of Anesthesia:  General    Additional requests/questions:   Caller Gunnar Fusi) stated patient will have general anesthesia for 90 minutes.  Signed, Annetta Maw   11/27/2022, 10:27 AM

## 2023-09-22 NOTE — Progress Notes (Unsigned)
 Cardiology Office Note   Date:  09/23/2023   ID:  Bryan Clark, DOB Jun 29, 1970, MRN [id number]  PCP:  [facility]  Cardiologist:   Vina Gull, MD   Patient presents for follow up of HFrEF    History of Present Illness: Bryan Clark is a 53 y.o. male with a history of  NICM, HTN and HL     I saw him in cardiology in 2013  Cardiac cath showed no CAD   Echo in 2015 LVEF 45 to 50%.     I saw the pt in clinic in Sept 2024  Pt complains of R side CP    Comes and goes   not assoc with activity  No dyspnea on exertion   NO CP with exertion Walks 3 miles per day   Fast     Diet Water   not a lot of carbs  Occasional burger     Current Meds  Medication Sig   Ascorbic Acid (VITAMIN C) 1000 MG tablet Take 1,000 mg by mouth daily.   aspirin EC 81 MG tablet Take 81 mg by mouth in the morning and at bedtime.   atorvastatin  (LIPITOR) 40 MG tablet Take 1 tablet (40 mg total) by mouth daily.   carvedilol  (COREG ) 12.5 MG tablet TAKE 1 AND 1/2 TABLETS BY MOUTH TWICE DAILY WITH MEALS.   colesevelam (WELCHOL) 625 MG tablet Take 1,875 mg by mouth 2 (two) times daily.   cyanocobalamin (VITAMIN B12) 1000 MCG tablet Take 1,000 mcg by mouth daily.   Empagliflozin-Linagliptin 25-5 MG TABS Take 25 mg by mouth daily.   EPINEPHrine  (EPIPEN  2-PAK) 0.3 mg/0.3 mL IJ SOAJ injection Inject 0.3 mLs (0.3 mg total) into the muscle as needed for anaphylaxis (As need for bee stings).   furosemide  (LASIX ) 20 MG tablet Take 1 tablet (20 mg total) by mouth daily.   gemfibrozil (LOPID) 600 MG tablet Take 600 mg by mouth 2 (two) times daily.   lisinopril  (ZESTRIL ) 10 MG tablet TAKE ONE TABLET BY MOUTH ONCE DAILY.   metFORMIN  (GLUCOPHAGE -XR) 500 MG 24 hr tablet Take 1,000 mg by mouth 2 (two) times daily.   Multiple Vitamin (MULTIVITAMIN WITH MINERALS) TABS tablet Take 1 tablet by mouth daily.   nitroGLYCERIN (NITROSTAT) 0.4 MG SL tablet Place 0.4 mg under the tongue as needed.   Omega-3 Fatty  Acids (FISH OIL) 500 MG CAPS Take 500 mg by mouth.   spironolactone  (ALDACTONE ) 25 MG tablet Take 1 tablet (25 mg total) by mouth daily.   terbinafine (LAMISIL) 250 MG tablet Take 250 mg by mouth daily.   Vitamin D, Ergocalciferol, (DRISDOL) 1.25 MG (50000 UNIT) CAPS capsule Take 50,000 Units by mouth once a week.     Allergies:   Bee venom   Past Medical History:  Diagnosis Date   Acute systolic heart failure (HCC)    exacerbation   Cardiomyopathy    nonischemic ejection fraction 20% by cardiac cath   High cholesterol    Hypertension     Past Surgical History:  Procedure Laterality Date   bilaterial pleural effusion     BLADDER SURGERY     trauma as a child during martial arts training     Social History:  The patient  reports that he has never smoked. He has never used smokeless tobacco. He reports current alcohol use. He reports that he does not currently use drugs.   Family History:  The patient's family history includes Heart attack in his  mother; Heart disease in his sister; Hypertension in his mother.    ROS:  Please see the history of present illness. All other systems are reviewed and  Negative to the above problem except as noted.    PHYSICAL EXAM: VS:  BP 110/78 (BP Location: Right Arm, Patient Position: Sitting)   Pulse 93   Ht 5' 9 (1.753 m)   Wt 205 lb (93 kg)   SpO2 95%   BMI 30.27 kg/m   GEN: Obese 53 yo in NAD  HEENT: normal  Neck: no JVD, carotid bruits  Cardiac: RRR; no murmur, No LE edema  Respiratory:  clear to auscultation GI: soft, nontender  No hepatomegaly  MS: no deformity Moving all extremities     EKG:  EKG is ordered today.  NSR 93 bpm  LAD   Echo 2023  1. Left ventricular ejection fraction by 3D volume is 45 %. The left  ventricle has mildly decreased function. The left ventricle has no  regional wall motion abnormalities. Left ventricular diastolic parameters  are consistent with Grade I diastolic  dysfunction (impaired  relaxation).   2. Right ventricular systolic function is normal. The right ventricular  size is normal.   3. The mitral valve is normal in structure. No evidence of mitral valve  regurgitation. No evidence of mitral stenosis.   4. The aortic valve is normal in structure. Aortic valve regurgitation is  trivial. No aortic stenosis is present.   5. The inferior vena cava is normal in size with greater than 50%  respiratory variability, suggesting right atrial pressure of 3 mmHg.   Comparison(s): 04/19/13 EF 45-50%.   Lipid Panel    Component Value Date/Time   CHOL 139 08/06/2020 1259   TRIG 381 (H) 08/06/2020 1259   HDL 35 (L) 08/06/2020 1259   CHOLHDL 4.0 08/06/2020 1259   CHOLHDL 5.2 (H) 05/01/2016 1224   VLDL NOT CALC 05/01/2016 1224   LDLCALC 47 08/06/2020 1259      Wt Readings from Last 3 Encounters:  09/23/23 205 lb (93 kg)  11/18/22 201 lb (91.2 kg)  05/25/22 203 lb 12.8 oz (92.4 kg)      ASSESSMENT AND PLAN:  1  Chest pain  Atypical  R sided  Not associated with activity  2  HTN  BP is well controlled today   3  Hx HFmrEF    nonischemic  LVEF 45% in Nov 2023. Volume status good   He remains very active Keep on current regimen of carvedilol , lasix , lisinopril , spironolactone   4   HL  Will check lipomed, lpa Apo B  5  DM  Check A1C     6  Melanoma   Pt had resection.  Continues to get follow up at [facility] for follow up in 1 year        Current medicines are reviewed at length with the patient today.  The patient does not have concerns regarding medicines.  Signed, Vina Gull, MD  09/23/2023 9:00 AM    [facility] [address] [place], [place], [place]  [address] Phone: [phone]; Fax: [phone]

## 2023-09-23 ENCOUNTER — Ambulatory Visit: Attending: Internal Medicine | Admitting: Internal Medicine

## 2023-09-23 ENCOUNTER — Encounter: Payer: Self-pay | Admitting: Internal Medicine

## 2023-09-23 VITALS — BP 110/78 | HR 93 | Ht 69.0 in | Wt 205.0 lb

## 2023-09-23 DIAGNOSIS — Z139 Encounter for screening, unspecified: Secondary | ICD-10-CM

## 2023-09-23 DIAGNOSIS — I428 Other cardiomyopathies: Secondary | ICD-10-CM | POA: Diagnosis not present

## 2023-09-23 DIAGNOSIS — I2510 Atherosclerotic heart disease of native coronary artery without angina pectoris: Secondary | ICD-10-CM

## 2023-09-23 DIAGNOSIS — E785 Hyperlipidemia, unspecified: Secondary | ICD-10-CM

## 2023-09-23 DIAGNOSIS — I5022 Chronic systolic (congestive) heart failure: Secondary | ICD-10-CM

## 2023-09-23 DIAGNOSIS — I10 Essential (primary) hypertension: Secondary | ICD-10-CM | POA: Diagnosis not present

## 2023-09-23 DIAGNOSIS — Z79899 Other long term (current) drug therapy: Secondary | ICD-10-CM

## 2023-09-23 DIAGNOSIS — E119 Type 2 diabetes mellitus without complications: Secondary | ICD-10-CM

## 2023-09-23 NOTE — Patient Instructions (Addendum)
 Medication Instructions:   Continue all current medications.   Labwork:  CBC, CMET, TSH, HgbA1c, PSA, Vit D, LPa, ApoB, NMR panel - orders given today Office will contact with results via phone, letter or mychart.     Testing/Procedures:  none  Follow-Up:  Your physician wants you to follow up in:  1 year.  You should receive a recall letter in the mail about 2 months prior to the time you are due.  If you don't receive this, please call our office to schedule your follow up appointment.      Any Other Special Instructions Will Be Listed Below (If Applicable).   If you need a refill on your cardiac medications before your next appointment, please call your pharmacy.

## 2023-09-24 ENCOUNTER — Ambulatory Visit: Payer: Self-pay | Admitting: *Deleted

## 2023-09-24 DIAGNOSIS — I10 Essential (primary) hypertension: Secondary | ICD-10-CM

## 2023-09-24 DIAGNOSIS — E785 Hyperlipidemia, unspecified: Secondary | ICD-10-CM

## 2023-09-24 DIAGNOSIS — I2510 Atherosclerotic heart disease of native coronary artery without angina pectoris: Secondary | ICD-10-CM

## 2023-09-24 DIAGNOSIS — I5022 Chronic systolic (congestive) heart failure: Secondary | ICD-10-CM

## 2023-09-24 DIAGNOSIS — Z79899 Other long term (current) drug therapy: Secondary | ICD-10-CM

## 2023-09-24 DIAGNOSIS — I428 Other cardiomyopathies: Secondary | ICD-10-CM

## 2023-09-24 DIAGNOSIS — Z139 Encounter for screening, unspecified: Secondary | ICD-10-CM

## 2023-09-29 ENCOUNTER — Telehealth: Payer: Self-pay | Admitting: Internal Medicine

## 2023-09-29 LAB — COMPREHENSIVE METABOLIC PANEL WITH GFR
ALT: 22 IU/L (ref 0–44)
AST: 17 IU/L (ref 0–40)
Albumin: 4.8 g/dL (ref 3.8–4.9)
Alkaline Phosphatase: 59 IU/L (ref 44–121)
BUN/Creatinine Ratio: 27 — ABNORMAL HIGH (ref 9–20)
BUN: 25 mg/dL — ABNORMAL HIGH (ref 6–24)
Bilirubin Total: 0.3 mg/dL (ref 0.0–1.2)
CO2: 18 mmol/L — ABNORMAL LOW (ref 20–29)
Calcium: 10.5 mg/dL — ABNORMAL HIGH (ref 8.7–10.2)
Chloride: 100 mmol/L (ref 96–106)
Creatinine, Ser: 0.92 mg/dL (ref 0.76–1.27)
Globulin, Total: 2.5 g/dL (ref 1.5–4.5)
Glucose: 123 mg/dL — ABNORMAL HIGH (ref 70–99)
Potassium: 4.6 mmol/L (ref 3.5–5.2)
Sodium: 139 mmol/L (ref 134–144)
Total Protein: 7.3 g/dL (ref 6.0–8.5)
eGFR: 100 mL/min/1.73 (ref >59)

## 2023-09-29 LAB — CBC
Hematocrit: 45.4 % (ref 37.5–51.0)
Hemoglobin: 15 g/dL (ref 13.0–17.7)
MCH: 32.6 pg (ref 26.6–33.0)
MCHC: 33 g/dL (ref 31.5–35.7)
MCV: 99 fL — ABNORMAL HIGH (ref 79–97)
Platelets: 146 x10E3/uL — ABNORMAL LOW (ref 150–450)
RBC: 4.6 x10E6/uL (ref 4.14–5.80)
RDW: 12.9 % (ref 11.6–15.4)
WBC: 6.2 x10E3/uL (ref 3.4–10.8)

## 2023-09-29 LAB — NMR, LIPOPROFILE
Cholesterol, Total: 250 mg/dL — AB (ref 100–199)
HDL Particle Number: 28.6 umol/L — AB (ref >=30.5)
HDL-C: 31 mg/dL — AB (ref >39)
LDL Particle Number: 1167 nmol/L — AB (ref <1000)
LDL Size: 19.7 nm — AB (ref >20.5)
LP-IR Score: 93 — AB (ref <=45)
Small LDL Particle Number: 881 nmol/L — AB (ref <=527)
Triglycerides: 1045 mg/dL — AB (ref 0–149)

## 2023-09-29 LAB — HEMOGLOBIN A1C
Est. average glucose Bld gHb Est-mCnc: 160 mg/dL
Hgb A1c MFr Bld: 7.2 % — ABNORMAL HIGH (ref 4.8–5.6)

## 2023-09-29 LAB — APOLIPOPROTEIN B: Apolipoprotein B: 123 mg/dL — ABNORMAL HIGH (ref <90)

## 2023-09-29 LAB — LIPOPROTEIN A (LPA): Lipoprotein (a): 41.5 nmol/L (ref <75.0)

## 2023-09-29 LAB — TSH: TSH: 0.642 u[IU]/mL (ref 0.450–4.500)

## 2023-09-29 LAB — PSA: Prostate Specific Ag, Serum: 0.7 ng/mL (ref 0.0–4.0)

## 2023-09-29 LAB — VITAMIN D 25 HYDROXY (VIT D DEFICIENCY, FRACTURES): Vit D, 25-Hydroxy: 19 ng/mL — ABNORMAL LOW (ref 30.0–100.0)

## 2023-09-29 MED ORDER — VITAMIN D-3 125 MCG (5000 UT) PO TABS: 1.0000 | ORAL_TABLET | Freq: Every day | ORAL | Status: AC

## 2023-09-29 MED ORDER — ICOSAPENT ETHYL 1 G PO CAPS: 2.0000 g | ORAL_CAPSULE | Freq: Two times a day (BID) | ORAL | 11 refills | Status: AC

## 2023-09-29 MED ORDER — FENOFIBRATE 145 MG PO TABS
145.0000 mg | ORAL_TABLET | Freq: Every day | ORAL | 3 refills | Status: AC
Start: 2023-09-29 — End: ?

## 2023-09-29 NOTE — Telephone Encounter (Signed)
 Pt c/o medication issue:  1. Name of Medication:   fenofibrate  (TRICOR ) 145 MG tablet   colesevelam (WELCHOL) 625 MG tablet   2. How are you currently taking this medication (dosage and times per day)?   3. Are you having a reaction (difficulty breathing--STAT)?   4. What is your medication issue?    Caller noted patient has been prescribed these two medications which may have medication interaction.  Caller wants a call back to confirm which medication patient should be taking.

## 2023-09-29 NOTE — Telephone Encounter (Signed)
 Reviewed results with wife    Would stop Welchol and gemfibrozil  She claims he is taking  Stop OTC fish oil   Recomm Tricor  145 mg daily Add Vasepa 2 g bid        Follow up lipid panel and liver panel and amylase/lipase in 4 wks      Wife claims he has been taking Vit D 50,000 U/week for 8 months  Would recomm 5000 U per day    Stop weekly   REcomm Lipomed and CMET in 12 wks    Check Vit D     Wife admits they need to eat better   Eating burgers   and drinking sodas and juice   Needs to stop      Pharmacy notified.

## 2023-09-29 NOTE — Telephone Encounter (Signed)
 Pt spouse called in about getting this med sent over. Please advise.    [address] - [place], [place] - [address] Phone: [phone]  Fax: [phone]

## 2023-09-29 NOTE — Telephone Encounter (Signed)
 Routing to [employer]

## 2023-10-15 LAB — LIPID PANEL
Chol/HDL Ratio: 5.7 ratio — ABNORMAL HIGH (ref 0.0–5.0)
Cholesterol, Total: 182 mg/dL (ref 100–199)
HDL: 32 mg/dL — ABNORMAL LOW (ref >39)
LDL Chol Calc (NIH): 72 mg/dL (ref 0–99)
Triglycerides: 495 mg/dL — ABNORMAL HIGH (ref 0–149)
VLDL Cholesterol Cal: 78 mg/dL — ABNORMAL HIGH (ref 5–40)

## 2023-10-15 LAB — LIPASE: Lipase: 73 U/L (ref 13–78)

## 2023-10-15 LAB — HEPATIC FUNCTION PANEL
ALT: 37 [IU]/L (ref 0–44)
AST: 27 [IU]/L (ref 0–40)
Albumin: 4.3 g/dL (ref 3.8–4.9)
Alkaline Phosphatase: 52 [IU]/L (ref 44–121)
Bilirubin Total: 0.3 mg/dL (ref 0.0–1.2)
Bilirubin, Direct: 0.12 mg/dL (ref 0.00–0.40)
Total Protein: 6.7 g/dL (ref 6.0–8.5)

## 2023-10-15 LAB — AMYLASE: Amylase: 59 U/L (ref 31–110)

## 2023-10-16 LAB — COMPREHENSIVE METABOLIC PANEL WITH GFR
ALT: 38 IU/L (ref 0–44)
AST: 28 IU/L (ref 0–40)
Albumin: 4.4 g/dL (ref 3.8–4.9)
Alkaline Phosphatase: 49 IU/L (ref 44–121)
BUN/Creatinine Ratio: 19 (ref 9–20)
BUN: 23 mg/dL (ref 6–24)
Bilirubin Total: 0.2 mg/dL (ref 0.0–1.2)
CO2: 21 mmol/L (ref 20–29)
Calcium: 10.1 mg/dL (ref 8.7–10.2)
Chloride: 102 mmol/L (ref 96–106)
Creatinine, Ser: 1.23 mg/dL (ref 0.76–1.27)
Globulin, Total: 2.5 g/dL (ref 1.5–4.5)
Glucose: 202 mg/dL — ABNORMAL HIGH (ref 70–99)
Potassium: 4.9 mmol/L (ref 3.5–5.2)
Sodium: 141 mmol/L (ref 134–144)
Total Protein: 6.9 g/dL (ref 6.0–8.5)
eGFR: 71 mL/min/1.73 (ref >59)

## 2023-10-16 LAB — NMR, LIPOPROFILE
Cholesterol, Total: 193 mg/dL (ref 100–199)
HDL Particle Number: 29.8 umol/L — ABNORMAL LOW (ref >=30.5)
HDL-C: 29 mg/dL — ABNORMAL LOW (ref >39)
LDL Particle Number: 1454 nmol/L — ABNORMAL HIGH (ref <1000)
LDL Size: 19.5 nm — ABNORMAL LOW (ref >20.5)
LDL-C (NIH Calc): 79 mg/dL (ref 0–99)
LP-IR Score: 80 — ABNORMAL HIGH (ref <=45)
Small LDL Particle Number: 1259 nmol/L — ABNORMAL HIGH (ref <=527)
Triglycerides: 528 mg/dL — ABNORMAL HIGH (ref 0–149)

## 2023-10-16 LAB — VITAMIN D 25 HYDROXY (VIT D DEFICIENCY, FRACTURES): Vit D, 25-Hydroxy: 34.6 ng/mL (ref 30.0–100.0)

## 2024-02-08 ENCOUNTER — Other Ambulatory Visit: Payer: Self-pay

## 2024-02-08 ENCOUNTER — Emergency Department

## 2024-02-08 ENCOUNTER — Encounter: Payer: Self-pay | Admitting: Family Medicine

## 2024-02-08 ENCOUNTER — Observation Stay
Admission: EM | Admit: 2024-02-08 | Discharge: 2024-02-09 | Disposition: A | Attending: Emergency Medicine | Admitting: Emergency Medicine

## 2024-02-08 ENCOUNTER — Observation Stay

## 2024-02-08 DIAGNOSIS — E785 Hyperlipidemia, unspecified: Secondary | ICD-10-CM | POA: Insufficient documentation

## 2024-02-08 DIAGNOSIS — H538 Other visual disturbances: Secondary | ICD-10-CM | POA: Insufficient documentation

## 2024-02-08 DIAGNOSIS — R202 Paresthesia of skin: Principal | ICD-10-CM | POA: Insufficient documentation

## 2024-02-08 DIAGNOSIS — I110 Hypertensive heart disease with heart failure: Secondary | ICD-10-CM | POA: Insufficient documentation

## 2024-02-08 DIAGNOSIS — Z79899 Other long term (current) drug therapy: Secondary | ICD-10-CM | POA: Insufficient documentation

## 2024-02-08 DIAGNOSIS — F1090 Alcohol use, unspecified, uncomplicated: Secondary | ICD-10-CM | POA: Insufficient documentation

## 2024-02-08 DIAGNOSIS — Z85820 Personal history of malignant melanoma of skin: Secondary | ICD-10-CM | POA: Insufficient documentation

## 2024-02-08 DIAGNOSIS — R200 Anesthesia of skin: Principal | ICD-10-CM

## 2024-02-08 DIAGNOSIS — E1165 Type 2 diabetes mellitus with hyperglycemia: Secondary | ICD-10-CM | POA: Insufficient documentation

## 2024-02-08 DIAGNOSIS — I639 Cerebral infarction, unspecified: Secondary | ICD-10-CM | POA: Diagnosis present

## 2024-02-08 DIAGNOSIS — I5040 Unspecified combined systolic (congestive) and diastolic (congestive) heart failure: Secondary | ICD-10-CM | POA: Insufficient documentation

## 2024-02-08 DIAGNOSIS — Z7982 Long term (current) use of aspirin: Secondary | ICD-10-CM | POA: Diagnosis not present

## 2024-02-08 LAB — CBC
HCT: 43.1 % (ref 39.0–52.0)
Hemoglobin: 14.6 g/dL (ref 13.0–17.0)
MCH: 31.3 pg (ref 26.0–34.0)
MCHC: 33.9 g/dL (ref 30.0–36.0)
MCV: 92.5 fL (ref 80.0–100.0)
Platelets: 159 K/uL (ref 150–400)
RBC: 4.66 MIL/uL (ref 4.22–5.81)
RDW: 12.5 % (ref 11.5–15.5)
WBC: 5.9 K/uL (ref 4.0–10.5)
nRBC: 0 % (ref 0.0–0.2)

## 2024-02-08 LAB — DIFFERENTIAL
Abs Immature Granulocytes: 0.02 K/uL (ref 0.00–0.07)
Basophils Absolute: 0 K/uL (ref 0.0–0.1)
Basophils Relative: 1 %
Eosinophils Absolute: 0.1 K/uL (ref 0.0–0.5)
Eosinophils Relative: 2 %
Immature Granulocytes: 0 %
Lymphocytes Relative: 32 %
Lymphs Abs: 1.9 K/uL (ref 0.7–4.0)
Monocytes Absolute: 0.5 K/uL (ref 0.1–1.0)
Monocytes Relative: 8 %
Neutro Abs: 3.4 K/uL (ref 1.7–7.7)
Neutrophils Relative %: 57 %

## 2024-02-08 LAB — APTT: aPTT: 30 s (ref 24–36)

## 2024-02-08 LAB — TSH: TSH: 0.774 u[IU]/mL (ref 0.350–4.500)

## 2024-02-08 LAB — COMPREHENSIVE METABOLIC PANEL WITH GFR
ALT: 27 U/L (ref 0–44)
AST: 26 U/L (ref 15–41)
Albumin: 4.5 g/dL (ref 3.5–5.0)
Alkaline Phosphatase: 49 U/L (ref 38–126)
Anion gap: 15 (ref 5–15)
BUN: 21 mg/dL — ABNORMAL HIGH (ref 6–20)
CO2: 23 mmol/L (ref 22–32)
Calcium: 9.7 mg/dL (ref 8.9–10.3)
Chloride: 95 mmol/L — ABNORMAL LOW (ref 98–111)
Creatinine, Ser: 1.14 mg/dL (ref 0.61–1.24)
GFR, Estimated: >60 mL/min (ref >60)
Glucose, Bld: 275 mg/dL — ABNORMAL HIGH (ref 70–99)
Potassium: 4 mmol/L (ref 3.5–5.1)
Sodium: 132 mmol/L — ABNORMAL LOW (ref 135–145)
Total Bilirubin: 0.5 mg/dL (ref 0.0–1.2)
Total Protein: 7.2 g/dL (ref 6.5–8.1)

## 2024-02-08 LAB — LIPID PANEL
Cholesterol: 266 mg/dL — ABNORMAL HIGH (ref 0–200)
HDL: 31 mg/dL — ABNORMAL LOW (ref >40)
LDL Cholesterol: UNDETERMINED mg/dL (ref 0–99)
Total CHOL/HDL Ratio: 8.7 ratio
Triglycerides: 1013 mg/dL — ABNORMAL HIGH (ref <150)
VLDL: 203 mg/dL — ABNORMAL HIGH (ref 0–40)

## 2024-02-08 LAB — PROTIME-INR
INR: 0.9 (ref 0.8–1.2)
Prothrombin Time: 13.1 s (ref 11.4–15.2)

## 2024-02-08 LAB — HIV ANTIBODY (ROUTINE TESTING W REFLEX): HIV Screen 4th Generation wRfx: NONREACTIVE

## 2024-02-08 LAB — CBG MONITORING, ED: Glucose-Capillary: 246 mg/dL — ABNORMAL HIGH (ref 70–99)

## 2024-02-08 LAB — GLUCOSE, CAPILLARY: Glucose-Capillary: 252 mg/dL — ABNORMAL HIGH (ref 70–99)

## 2024-02-08 LAB — ETHANOL: Alcohol, Ethyl (B): <15 mg/dL (ref <15)

## 2024-02-08 MED ORDER — ICOSAPENT ETHYL 1 G PO CAPS
2.0000 g | ORAL_CAPSULE | Freq: Two times a day (BID) | ORAL | Status: DC
  Administered 2024-02-09: 2 g via ORAL
  Filled 2024-02-08 (×2): qty 2

## 2024-02-08 MED ORDER — MUSCLE RUB 10-15 % EX CREA: 1.0000 | TOPICAL_CREAM | CUTANEOUS | Status: DC | PRN

## 2024-02-08 MED ORDER — SODIUM CHLORIDE 0.9% FLUSH
3.0000 mL | Freq: Once | INTRAVENOUS | Status: AC
  Administered 2024-02-08: 3 mL via INTRAVENOUS

## 2024-02-08 MED ORDER — INSULIN ASPART 100 UNIT/ML IJ SOLN
0.0000 [IU] | Freq: Three times a day (TID) | INTRAMUSCULAR | Status: DC
  Administered 2024-02-09: 7 [IU] via SUBCUTANEOUS
  Administered 2024-02-09: 3 [IU] via SUBCUTANEOUS
  Filled 2024-02-08: qty 7
  Filled 2024-02-08: qty 3

## 2024-02-08 MED ORDER — INSULIN ASPART 100 UNIT/ML IJ SOLN
0.0000 [IU] | Freq: Every day | INTRAMUSCULAR | Status: DC
  Administered 2024-02-08: 3 [IU] via SUBCUTANEOUS
  Filled 2024-02-08: qty 3

## 2024-02-08 MED ORDER — COLESEVELAM HCL 625 MG PO TABS
1875.0000 mg | ORAL_TABLET | Freq: Two times a day (BID) | ORAL | Status: DC
  Administered 2024-02-09: 1875 mg via ORAL
  Filled 2024-02-08 (×2): qty 3

## 2024-02-08 MED ORDER — ATORVASTATIN CALCIUM 20 MG PO TABS
80.0000 mg | ORAL_TABLET | Freq: Every day | ORAL | Status: DC
  Administered 2024-02-08: 80 mg via ORAL
  Filled 2024-02-08: qty 4

## 2024-02-08 MED ORDER — CLOPIDOGREL BISULFATE 75 MG PO TABS
75.0000 mg | ORAL_TABLET | Freq: Every day | ORAL | Status: DC
  Administered 2024-02-08 – 2024-02-09 (×2): 75 mg via ORAL
  Filled 2024-02-08 (×2): qty 1

## 2024-02-08 MED ORDER — IOHEXOL 350 MG/ML SOLN
75.0000 mL | Freq: Once | INTRAVENOUS | Status: AC | PRN
  Administered 2024-02-08: 75 mL via INTRAVENOUS

## 2024-02-08 MED ORDER — ASPIRIN 81 MG PO TBEC
81.0000 mg | DELAYED_RELEASE_TABLET | Freq: Every day | ORAL | Status: DC
  Administered 2024-02-08 – 2024-02-09 (×2): 81 mg via ORAL
  Filled 2024-02-08 (×2): qty 1

## 2024-02-08 MED ORDER — SALINE SPRAY 0.65 % NA SOLN: 1.0000 | NASAL | Status: DC | PRN

## 2024-02-08 MED ORDER — FENOFIBRATE 160 MG PO TABS
160.0000 mg | ORAL_TABLET | Freq: Every day | ORAL | Status: DC
  Administered 2024-02-09: 160 mg via ORAL
  Filled 2024-02-08: qty 1

## 2024-02-08 NOTE — H&P (Signed)
 History and Physical    Patient: Bryan Clark FMW:[id number] DOB: Feb 07, 1971 DOA: 02/08/2024 DOS: the patient was seen and examined on 02/08/2024 PCP: Edman Meade PEDLAR, FNP  Patient coming from: Home  Chief Complaint:  Chief Complaint  Patient presents with   Numbness   HPI: Bryan Clark is a 52 y.o. male with medical history significant of nonischemic cardiomyopathy, hypertension, hyperlipidemia, melanoma stage Ib (negative sentinel node). Patient presented to the ED due to an episode of facial flushing, sensation changes in the left, and left-sided blurry vision.  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 Code stroke was called. Initial head CT was unremarkable.  Lab evaluation unremarkable with the exception of hyperglycemia.  TNK was not administered due to symptoms being too mild to treat. Symptoms have been waxing and waning while in the ED.  Vitals have remained stable. Patient was recommended to admit to hospitalist service for continued monitoring on telemetry.    At the time my evaluation patient is asymptomatic. When discussing his chronic conditions he reports he has been adherent to all medications except for his diabetes pills which he ran out of a month ago and has a difficulty getting a refill for.   Review of Systems: As mentioned in the history of present illness. All other systems reviewed and are negative. Past Medical History:  Diagnosis Date   Acute systolic heart failure (HCC)    exacerbation   Cardiomyopathy    nonischemic ejection fraction 20% by cardiac cath   High cholesterol    Hypertension    Past Surgical History:  Procedure Laterality Date   bilaterial pleural effusion     BLADDER SURGERY     trauma as a child during martial arts training   Social History:  reports that he has never smoked. He has never used smokeless tobacco. He reports current alcohol use. He reports that he does  not currently use drugs.  Allergies  Allergen Reactions   Bee Venom Anaphylaxis    Family History  Problem Relation Age of Onset   Heart attack Mother    Hypertension Mother    Heart disease Sister     Prior to Admission medications   Medication Sig Start Date End Date Taking? Authorizing Provider  Ascorbic Acid (VITAMIN C) 1000 MG tablet Take 1,000 mg by mouth daily.    [provider]  aspirin EC 81 MG tablet Take 81 mg by mouth in the morning and at bedtime.    [provider]  atorvastatin  (LIPITOR) 40 MG tablet Take 1 tablet (40 mg total) by mouth daily. 04/27/16   Jerilynn Lamarr HERO, NP  carvedilol  (COREG ) 12.5 MG tablet TAKE 1 AND 1/2 TABLETS BY MOUTH TWICE DAILY WITH MEALS. 09/27/17   Alvan Dorn FALCON, MD  Cholecalciferol (VITAMIN D -3) 125 MCG (5000 UT) TABS Take 1 tablet by mouth daily. 09/29/23   Okey Vina GAILS, MD  colesevelam [facility]) 625 MG tablet Take 1,875 mg by mouth 2 (two) times daily. 11/05/21   [provider]  cyanocobalamin (VITAMIN B12) 1000 MCG tablet Take 1,000 mcg by mouth daily.    [provider]  Empagliflozin-Linagliptin 25-5 MG TABS Take 25 mg by mouth daily.    [provider]  EPINEPHrine  (EPIPEN  2-PAK) 0.3 mg/0.3 mL IJ SOAJ injection Inject 0.3 mLs (0.3 mg total) into the muscle as needed for anaphylaxis (As need for bee stings). 09/21/19   Saunders Shona CROME,  PA-C  fenofibrate  (TRICOR ) 145 MG tablet Take 1 tablet (145 mg total) by mouth daily. 09/29/23   Okey Vina GAILS, MD  furosemide  (LASIX ) 20 MG tablet Take 1 tablet (20 mg total) by mouth daily. 04/27/16   Jerilynn Lamarr HERO, NP  icosapent  Ethyl (VASCEPA ) 1 g capsule Take 2 capsules (2 g total) by mouth 2 (two) times daily. 09/29/23   Okey Vina GAILS, MD  lisinopril  (ZESTRIL ) 10 MG tablet TAKE ONE TABLET BY MOUTH ONCE DAILY. 01/02/22   Wyn Jackee VEAR Mickey., NP  metFORMIN  (GLUCOPHAGE -XR) 500 MG 24 hr tablet Take 1,000 mg by mouth 2 (two) times daily. 03/11/22    [provider]  Multiple Vitamin (MULTIVITAMIN WITH MINERALS) TABS tablet Take 1 tablet by mouth daily.    [provider]  nitroGLYCERIN (NITROSTAT) 0.4 MG SL tablet Place 0.4 mg under the tongue as needed. 09/10/20   [provider]  spironolactone  (ALDACTONE ) 25 MG tablet Take 1 tablet (25 mg total) by mouth daily. 01/08/22   Wyn Jackee VEAR Mickey., NP  terbinafine (LAMISIL) 250 MG tablet Take 250 mg by mouth daily. 11/05/21   [provider]    Physical Exam: Vitals:   02/08/24 1314 02/08/24 1600 02/08/24 1630 02/08/24 1700  BP:  116/86 116/87 133/89  Pulse:  72 76 78  Resp:  17 15 14   Temp:      TempSrc:      SpO2:  94% 92% 95%  Weight: 90.7 kg     Height: 5' 9 (1.753 m)      Constitutional:  Normal appearance. Non toxic-appearing.  HENT: Head Normocephalic and atraumatic.  Mucous membranes are moist.  Eyes:  Extraocular intact. Conjunctivae normal. Pupils are equal, round, and reactive to light.  Cardiovascular: Rate and Rhythm: Normal rate and regular rhythm.  Pulmonary: Non labored, symmetric rise of chest wall.  Musculoskeletal:  Normal range of motion.  Skin: warm and dry. not jaundiced.  Neurological: 5/5 strength upper and lower extremity bilaterally, alert and oriented x 4 Psychiatric: Mood and Affect congruent.   Data Reviewed:    Latest Ref Rng & Units 02/08/2024    1:24 PM 09/23/2023   12:08 PM 09/07/2020   10:45 PM  CBC  WBC 4.0 - 10.5 K/uL 5.9  6.2  8.0   Hemoglobin 13.0 - 17.0 g/dL 85.3  84.9  83.0   Hematocrit 39.0 - 52.0 % 43.1  45.4  50.4   Platelets 150 - 400 K/uL 159  146  152       Latest Ref Rng & Units 02/08/2024    1:24 PM 10/14/2023    8:20 AM 10/14/2023    8:17 AM  CMP  Glucose 70 - 99 mg/dL 724  797    BUN 6 - 20 mg/dL 21  23    Creatinine 9.38 - 1.24 mg/dL 8.85  8.76    Sodium 864 - 145 mmol/L 132  141    Potassium 3.5 - 5.1 mmol/L 4.0  4.9    Chloride 98 - 111 mmol/L 95  102    CO2 22 - 32 mmol/L 23  21     Calcium  8.9 - 10.3 mg/dL 9.7  89.8    Total Protein 6.5 - 8.1 g/dL 7.2  6.9  6.7   Total Bilirubin 0.0 - 1.2 mg/dL 0.5  0.2  0.3   Alkaline Phos 38 - 126 U/L 49  49  52   AST 15 - 41 U/L 26  28  27  ALT 0 - 44 U/L 27  38  37    CT ANGIO HEAD NECK W WO CM EXAM: CTA HEAD AND NECK WITHOUT AND WITH 02/08/2024 01:57:28 PM  TECHNIQUE: CTA of the head and neck was performed without and with the administration of 75 mL of iohexol  (OMNIPAQUE ) 350 MG/ML injection. Multiplanar 2D and/or 3D reformatted images are provided for review. Automated exposure control, iterative reconstruction, and/or weight based adjustment of the mA/kV was utilized to reduce the radiation dose to as low as reasonably achievable. Stenosis of the internal carotid arteries measured using NASCET criteria.  COMPARISON: None available  CLINICAL HISTORY: Acute neuro deficit. Left arm and facial numbness.  FINDINGS:  CTA NECK: AORTIC ARCH AND ARCH VESSELS: No dissection or arterial injury. No significant stenosis of the brachiocephalic or subclavian arteries.  CERVICAL CAROTID ARTERIES: Minimal calcified plaque in the right carotid bulb. No dissection, arterial injury, or hemodynamically significant stenosis by NASCET criteria.  CERVICAL VERTEBRAL ARTERIES: Codominant vertebral arteries. Small amount of calcified plaque at the right vertebral artery origin without a significant stenosis. No dissection or arterial injury.  LUNGS AND MEDIASTINUM: Unremarkable.  SOFT TISSUES: No acute abnormality.  BONES: Mild cervical spondylosis.  CTA HEAD: ANTERIOR CIRCULATION: The intracranial internal carotid arteries are widely patent. ACAs and MCAs are patent without evidence of a proximal branch occlusion or significant proximal stenosis. No aneurysm.  POSTERIOR CIRCULATION: The intracranial vertebral arteries are widely patent to the basilar. Patent PICA and SCA origins are visualized bilaterally. The  basilar artery is widely patent. There are moderate sized right and diminutive or absent left posterior communicating arteries. Both PCAs are patent with mild diffuse irregularity. There is a moderate left P2 stenosis. No aneurysm.  OTHER: No dural venous sinus thrombosis on this non-dedicated study.  IMPRESSION: 1. No large vessel occlusion. 2. Moderate left P2 stenosis. 3. No other evidence of a significant stenosis in the head or neck. 4. These results were communicated to Dr. KYM Ross at 2:17 PM on 02/08/2024 by secure text page via the [facility] messaging system.  Electronically signed by: Dasie Hamburg MD 02/08/2024 02:29 PM EST RP Workstation: [id number] CT HEAD CODE STROKE WO CONTRAST EXAM: CT HEAD WITHOUT CONTRAST 02/08/2024 01:30:01 PM  TECHNIQUE: CT of the head was performed without the administration of intravenous contrast. Automated exposure control, iterative reconstruction, and/or weight based adjustment of the mA/kV was utilized to reduce the radiation dose to as low as reasonably achievable.  COMPARISON: Head CT 03/18/2004.  CLINICAL HISTORY: Left upper extremity and facial numbness.  FINDINGS:  BRAIN AND VENTRICLES: There is no evidence of an acute infarct, intracranial hemorrhage, mass, midline shift, hydrocephalus, or extra-axial fluid collection. Cerebral volume is normal.  ORBITS: No acute abnormality.  SINUSES: Small volume, likely chronic mastoid fluid bilaterally. Partially visualized mucosal thickening in the left maxillary sinus.  SOFT TISSUES AND SKULL: No acute soft tissue abnormality. No skull fracture.  Alberta Stroke Program Early CT Score (ASPECTS) ----- Ganglionic (caudate, IC, lentiform nucleus, insula, M1-M3): 7 Supraganglionic (M4-M6): 3 Total: 10  IMPRESSION: 1. No acute intracranial abnormality. ASPECTS of 10. 2. These results were communicated to Dr. KYM Ross at 1:54 PM on February 08, 2024 by secure text page via the [facility]  messaging system.  Electronically signed by: Dasie Hamburg MD 02/08/2024 01:55 PM EST RP Workstation: [id number]    Assessment and Plan: Facial numbness, vision disturbance - Presented as a code stroke - Initial head CT without acute intracranial abnormality - CTA head and neck: No  LVO, moderate left P2 stenosis. - Brain MRI pending - TTE with bubble study pending - A1c and LDL for risk stratification - TSH pending - Continue with DAPT for now, followed by Plavix monotherapy after that - Stat head CT for any changes - Monitor on telemetry - PT/OT evals - Neurology to follow  Hypertension - Allow for permissive hypertension x 48 hours or until stroke ruled out on MRI.  BP goal less than 220/110 - Blood pressures currently at goal without medication  Hyperlipidemia - LDL ordered.  Patient has had extensive lipid workup in the past - Follows with cardiology.  Currently on Vascepa , atorvastatin , fenofibrate , and welchol -- Resume all home meds for now  Type 2 diabetes hyperglycemia - Taking metformin , empagliflozin, linagliptin, at home.  Has recently been out of linagliptin and empagliflozin. - Initiate sliding scale insulin for now - Hemoglobin A1c ordered and pending  History of Melanoma - Sentinel lymph node biopsy negative - Follows outpatient with dermatology and oncology.  No active treatment  Combined heart failure - Most recent echocardiogram 2023: EF 45%, mildly decreased left ventricular function, no regional wall motion abnormalities, grade 1 diastolic dysfunction. - Taking daily Lasix , lisinopril , spironolactone  at home.  Hold all of the above for permissive hypertension for now. - Clinically euvolemic on arrival. - Echocardiogram ordered as above   Advance Care Planning: CODE STATUS conversation had with patient in the presence of his wife who is at bedside.  For now he would like to be full code  Consults: Neurology   Family Communication: His wife was  present at bedside for evaluation  Severity of Illness: The appropriate patient status for this patient is OBSERVATION. Observation status is judged to be reasonable and necessary in order to provide the required intensity of service to ensure the patient's safety. The patient's presenting symptoms, physical exam findings, and initial radiographic and laboratory data in the context of their medical condition is felt to place them at decreased risk for further clinical deterioration. Furthermore, it is anticipated that the patient will be medically stable for discharge from the hospital within 2 midnights of admission.   Author: Quintana Canelo, DO 02/08/2024 5:39 PM  For on call review [email or web address].

## 2024-02-08 NOTE — Progress Notes (Signed)
 CODE STROKE- PHARMACY COMMUNICATION   Time CODE STROKE called/page received: 13:33  Time response to CODE STROKE was made (in person or via phone): 13:36  Time Stroke Kit retrieved from [employer] (only if needed): Not needed. Patient's symptoms are mild and TNK not indicated. Will just continue to monitor  Name of Provider/Nurse contacted:Dr. Matthews  Past Medical History:  Diagnosis Date   Acute systolic heart failure (HCC)    exacerbation   Cardiomyopathy    nonischemic ejection fraction 20% by cardiac cath   High cholesterol    Hypertension    Prior to Admission medications   Medication Sig Start Date End Date Taking? Authorizing Provider  Ascorbic Acid (VITAMIN C) 1000 MG tablet Take 1,000 mg by mouth daily.    [provider]  aspirin EC 81 MG tablet Take 81 mg by mouth in the morning and at bedtime.    [provider]  atorvastatin  (LIPITOR) 40 MG tablet Take 1 tablet (40 mg total) by mouth daily. 04/27/16   Jerilynn Lamarr HERO, NP  carvedilol  (COREG ) 12.5 MG tablet TAKE 1 AND 1/2 TABLETS BY MOUTH TWICE DAILY WITH MEALS. 09/27/17   Alvan Dorn FALCON, MD  Cholecalciferol (VITAMIN D -3) 125 MCG (5000 UT) TABS Take 1 tablet by mouth daily. 09/29/23   Okey Vina GAILS, MD  colesevelam [facility]) 625 MG tablet Take 1,875 mg by mouth 2 (two) times daily. 11/05/21   [provider]  cyanocobalamin (VITAMIN B12) 1000 MCG tablet Take 1,000 mcg by mouth daily.    [provider]  Empagliflozin-Linagliptin 25-5 MG TABS Take 25 mg by mouth daily.    [provider]  EPINEPHrine  (EPIPEN  2-PAK) 0.3 mg/0.3 mL IJ SOAJ injection Inject 0.3 mLs (0.3 mg total) into the muscle as needed for anaphylaxis (As need for bee stings). 09/21/19   Saunders Shona CROME, PA-C  fenofibrate  (TRICOR ) 145 MG tablet Take 1 tablet (145 mg total) by mouth daily. 09/29/23   Okey Vina GAILS, MD  furosemide  (LASIX ) 20 MG tablet Take 1 tablet (20 mg total) by mouth daily. 04/27/16   Jerilynn Lamarr HERO, NP  icosapent  Ethyl (VASCEPA ) 1 g capsule Take 2 capsules (2 g total) by mouth 2 (two) times daily. 09/29/23   Okey Vina GAILS, MD  lisinopril  (ZESTRIL ) 10 MG tablet TAKE ONE TABLET BY MOUTH ONCE DAILY. 01/02/22   Wyn Jackee VEAR Mickey., NP  metFORMIN  (GLUCOPHAGE -XR) 500 MG 24 hr tablet Take 1,000 mg by mouth 2 (two) times daily. 03/11/22   [provider]  Multiple Vitamin (MULTIVITAMIN WITH MINERALS) TABS tablet Take 1 tablet by mouth daily.    [provider]  nitroGLYCERIN (NITROSTAT) 0.4 MG SL tablet Place 0.4 mg under the tongue as needed. 09/10/20   [provider]  spironolactone  (ALDACTONE ) 25 MG tablet Take 1 tablet (25 mg total) by mouth daily. 01/08/22   Wyn Jackee VEAR Mickey., NP  terbinafine (LAMISIL) 250 MG tablet Take 250 mg by mouth daily. 11/05/21   [provider]    Ransom Blanch PGY-1 Pharmacy Resident  [place] [facility]  02/08/2024 1:45 PM

## 2024-02-08 NOTE — Consult Note (Signed)
 NEUROLOGY CONSULT NOTE   Date of service: February 08, 2024 Patient Name: Bryan Clark MRN:  [id number] DOB:  1970/10/02 Chief Complaint: stroke code Requesting Provider: Dr. Ester Sharps  History of Present Illness   Stroke code page 1325 Neurologist arrival 1329  Bryan Clark is a 53 y.o. male with hx of nonischemic cardiomyopathy, HTN, HL, melanoma stage 1b (negative sentinel node). Patient presented to ED 2/2 episode of facial flushing f/b numbness in his L lower face, L tongue, and L arm. The sx resolved after he sat down but occurred 2x more in the next hour which prompted him to seek care in the ED. On my exam, NIHSS = 1 for sensory deficit. No chest, back, or neck pain. No hx trauma. TNK not administered 2/2 sx too mild to treat.   LKW 1200 NIHSS = 1 mRS = 0   ROS   Comprehensive ROS performed and pertinent positives documented in HPI   Past History   Past Medical History:  Diagnosis Date   Acute systolic heart failure (HCC)    exacerbation   Cardiomyopathy    nonischemic ejection fraction 20% by cardiac cath   High cholesterol    Hypertension     Past Surgical History:  Procedure Laterality Date   bilaterial pleural effusion     BLADDER SURGERY     trauma as a child during martial arts training    Family History: Family History  Problem Relation Age of Onset   Heart attack Mother    Hypertension Mother    Heart disease Sister     Social History  reports that he has never smoked. He has never used smokeless tobacco. He reports current alcohol use. He reports that he does not currently use drugs.  Allergies  Allergen Reactions   Bee Venom Anaphylaxis    Medications   Current Facility-Administered Medications:    aspirin EC tablet 81 mg, 81 mg, Oral, Daily, Dezii, Alexandra, DO, 81 mg at 02/09/24 9072   atorvastatin  (LIPITOR) tablet 80 mg, 80 mg, Oral, Daily, [name], RPH   clopidogrel (PLAVIX) tablet 75 mg, 75 mg, Oral, Daily,  Dezii, Alexandra, DO, 75 mg at 02/09/24 9072   colesevelam [facility]) tablet 1,875 mg, 1,875 mg, Oral, BID, Dezii, Alexandra, DO, 1,875 mg at 02/09/24 9073   fenofibrate  tablet 160 mg, 160 mg, Oral, Daily, Dezii, Alexandra, DO, 160 mg at 02/09/24 9072   icosapent  Ethyl (VASCEPA ) 1 g capsule 2 g, 2 g, Oral, BID, Dezii, Alexandra, DO, 2 g at 02/09/24 9072   insulin aspart (novoLOG) injection 0-5 Units, 0-5 Units, Subcutaneous, QHS, Dezii, Alexandra, DO, 3 Units at 02/08/24 2010   insulin aspart (novoLOG) injection 0-9 Units, 0-9 Units, Subcutaneous, TID WC, Dezii, Alexandra, DO, 3 Units at 02/09/24 9074   Muscle Rub CREA 1 Application, 1 Application, Topical, PRN, Dezii, Alexandra, DO   sodium chloride  (OCEAN) 0.65 % nasal spray 1 spray, 1 spray, Each Nare, PRN, Dezii, Alexandra, DO  Vitals   Vitals:   02/08/24 1951 02/08/24 2345 02/09/24 0345 02/09/24 0745  BP: 124/81 114/79 129/84 119/81  Pulse: 82 79 82 79  Resp: 18 17 17 19   Temp: (!) 97.5 F (36.4 C) 98.5 F (36.9 C) 97.9 F (36.6 C) 97.7 F (36.5 C)  TempSrc:    Oral  SpO2: 98% 98% 95% 97%  Weight: 93 kg     Height: 5' 9 (1.753 m)       Body mass index is 30.28 kg/m.  Physical Exam   Gen: patient lying on CT table, NAD CV: extremities appear well-perfused Resp: normal WOB  Neurologic exam MS: alert, oriented x4, follows commands Speech: no dysarthria, no aphasia CN: PERRL, VFF, EOMI, sensory deficit L lower face, face symmetric, hearing intact to voice Motor: 5/5 strength throughout Sensory: Sensory deficit LUE Reflexes: 2+ symm with toes down bilat Coordination: FNF intact bilat Gait: deferred   Labs/Imaging/Neurodiagnostic studies   CBC:  Recent Labs  Lab March 03, 2024 1324  WBC 5.9  NEUTROABS 3.4  HGB 14.6  HCT 43.1  MCV 92.5  PLT 159   Basic Metabolic Panel:  Lab Results  Component Value Date   NA 132 (L) 03-03-24   K 4.0 2024-03-03   CO2 23 03-Mar-2024   GLUCOSE 275 (H) 2024-03-03   BUN 21 (H)  March 03, 2024   CREATININE 1.14 03-03-24   CALCIUM  9.7 03/03/24   GFRNONAA >60 03-03-2024   GFRAA >60 03-Mar-2017   Lipid Panel:  Lab Results  Component Value Date   LDLCALC UNABLE TO CALCULATE IF TRIGLYCERIDE OVER 400 mg/dL 87/97/7974   YhaJ8r:  Lab Results  Component Value Date   HGBA1C 7.2 (H) 09/23/2023   Urine Drug Screen:     Component Value Date/Time   LABOPIA NONE DETECTED 06/30/2010 0005   COCAINSCRNUR NONE DETECTED 06/30/2010 0005   LABBENZ POSITIVE (A) 06/30/2010 0005   AMPHETMU NONE DETECTED 06/30/2010 0005   THCU NONE DETECTED 06/30/2010 0005   LABBARB  06/30/2010 0005    NONE DETECTED        DRUG SCREEN FOR MEDICAL PURPOSES ONLY.  IF CONFIRMATION IS NEEDED FOR ANY PURPOSE, NOTIFY LAB WITHIN 5 DAYS.        LOWEST DETECTABLE LIMITS FOR URINE DRUG SCREEN Drug Class       Cutoff (ng/mL) Amphetamine      1000 Barbiturate      200 Benzodiazepine   200 Tricyclics       300 Opiates          300 Cocaine          300 THC              50    Alcohol Level     Component Value Date/Time   ETH <15 2024-03-03 1324   INR  Lab Results  Component Value Date   INR 0.9 03/03/2024   APTT  Lab Results  Component Value Date   APTT 30 2024/03/03   AED levels: No results found for: PHENYTOIN, ZONISAMIDE, LAMOTRIGINE, LEVETIRACETA  CT Head without contrast(Personally reviewed): No acute process ASPECTS 10  ASSESSMENT   Bryan Clark is a 53 y.o. male with hx of nonischemic cardiomyopathy, HTN, HL, melanoma stage 1b (negative sentinel node). Patient presented to ED 2/2 episode of facial flushing f/b numbness in his L lower face, L tongue, and L arm. The sx resolved after he sat down but occurred 2x more in the next hour which prompted him to seek care in the ED. On my exam, NIHSS = 1 for sensory deficit. No chest, back, or neck pain. No hx trauma. TNK not administered 2/2 sx too mild to treat.   RECOMMENDATIONS   - CTA H&N - Bedside dysphagia  screen - Patient is on aspirin at home, last dose this AM  - Recommend close monitoring in ED with vital signs and NIHSS both q 30 min until outside of the TNK window at 1630.  If neurologic exam is worsens a stroke code should be immediately reactivated.  If exam is stable or improved at 1630 pt should at that point be admitted to the hospitalist service for stroke/TIA work-up.  After admission: - Permissive HTN x48 hrs from sx onset or until stroke ruled out by MRI goal BP <220/110. PRN labetalol or hydralazine if BP above these parameters. Avoid oral antihypertensives. - MRI brain wo contrast - TTE w/ bubble - Check A1c and LDL + add statin per guidelines - ASA 81mg  daily + plavix 75mg  daily x21 days f/b plavix 75mg  daily monotherapy after that - q4 hr neuro checks - STAT head CT for any change in neuro exam - Tele - PT/OT/SLP - Stroke education - Amb referral to neurology upon discharge   Will continue to follow. ______________________________________________________________________  Signed, Elida CHRISTELLA Ross, MD [facility]

## 2024-02-08 NOTE — ED Provider Notes (Signed)
-----------------------------------------   5:22 PM on 02/08/2024 ----------------------------------------- Patient care assumed from Dr. Claudene.  No significant findings on workup.  Patient did briefly have some return of symptoms to the face which lasted several minutes and then went away once again.  Patient has been seen by neurology Dr. Matthews who recommends admission to the hospital service for the remainder of the stroke workup.  Patient agreeable to plan of care.   Dorothyann Drivers, MD 02/08/24 [phone]

## 2024-02-08 NOTE — ED Notes (Signed)
 CODE STROKE!! [facility] Called speak with Zachary

## 2024-02-08 NOTE — ED Triage Notes (Addendum)
 Pt had an episode of a hot flash while standing in the court room. He sat down and sx subsided. Pt left to get lunch, and had another episode but his L arm went numb and weak, and his lips and left side of face started tingling. Pt says tingling/numbness is still present in his lips and his L arm still feels numb and weak. Pt endorses feeling pressure behind his left eye. Pt reports sx started at noon. Pt also reports blurred vision in L eye   No arm drift in triage. Equal strength to legs. No ataxia or aphasia. Face is symmetrical. Pt reports decreased sensation to L arm when touching.    CBG 246

## 2024-02-08 NOTE — ED Notes (Addendum)
 Patient told registration that his left side of his face felt numb again at 15:35. And his lips were tingly. RN went in to assess at 15:39 once RN had heard about neuro changes, and tested sensory on face and arms. Patient states it felt like his symptoms went back to feeling normal on his face but the right arm still felt stronger than the left arm.

## 2024-02-08 NOTE — ED Provider Notes (Signed)
 [facility] Provider Note    Event Date/Time   First MD Initiated Contact with Patient 02/08/24 1325     (approximate)   History   Numbness   HPI  Bryan Clark is a 53 y.o. male who presents to the ED for evaluation of Numbness   Review a cardiology clinic visit from July.  HTN, HLD and nonischemic cardiomyopathy, EF 45%.  Patient presents for evaluation of acute episode of flushing, sensation changes in the left and blurry vision from the left eye.  Symptoms started at 12 PM while he was standing at work.  He reports some waxing and waning with 3 brief episodes of the symptoms flaring up.   By the time I evaluate him after CT imaging he reports persistent tingling on the left side.   No chest or back pain/pressure, no headache or fall, no dizziness or syncope, no abdominal pain, emesis, recent illnesses, fever   Physical Exam   Triage Vital Signs: ED Triage Vitals  Encounter Vitals Group     BP 02/08/24 1309 (!) 148/100     Girls Systolic BP Percentile --      Girls Diastolic BP Percentile --      Boys Systolic BP Percentile --      Boys Diastolic BP Percentile --      Pulse Rate 02/08/24 1309 85     Resp 02/08/24 1309 16     Temp 02/08/24 1309 97.9 F (36.6 C)     Temp Source 02/08/24 1309 Oral     SpO2 02/08/24 1309 98 %     Weight 02/08/24 1314 200 lb (90.7 kg)     Height 02/08/24 1314 5' 9 (1.753 m)     Head Circumference --      Peak Flow --      Pain Score --      Pain Loc --      Pain Education --      Exclude from Growth Chart --     Most recent vital signs: Vitals:   02/08/24 1309  BP: (!) 148/100  Pulse: 85  Resp: 16  Temp: 97.9 F (36.6 C)  SpO2: 98%    General: Awake, no distress.  CV:  Good peripheral perfusion.  Resp:  Normal effort.  Abd:  No distention.  MSK:  No deformity noted.  Neuro:  NIH of 1 due to reported sensation deficit on the left side, left arm Other:     ED Results / Procedures /  Treatments   Labs (all labs ordered are listed, but only abnormal results are displayed) Labs Reviewed  COMPREHENSIVE METABOLIC PANEL WITH GFR - Abnormal; Notable for the following components:      Result Value   Sodium 132 (*)    Chloride 95 (*)    Glucose, Bld 275 (*)    BUN 21 (*)    All other components within normal limits  CBG MONITORING, ED - Abnormal; Notable for the following components:   Glucose-Capillary 246 (*)    All other components within normal limits  PROTIME-INR  APTT  CBC  DIFFERENTIAL  ETHANOL  CBG MONITORING, ED  I-STAT CREATININE, ED    EKG Sinus rhythm with a rate of 85 bpm.  Normal axis and intervals.  No acute signs of acute ischemia.  RADIOLOGY CT head interpreted by me without evidence of acute intracranial pathology  Official radiology report(s): CT ANGIO HEAD NECK W WO CM Result Date: 02/08/2024 EXAM: CTA HEAD  AND NECK WITHOUT AND WITH 02/08/2024 01:57:28 PM TECHNIQUE: CTA of the head and neck was performed without and with the administration of 75 mL of iohexol  (OMNIPAQUE ) 350 MG/ML injection. Multiplanar 2D and/or 3D reformatted images are provided for review. Automated exposure control, iterative reconstruction, and/or weight based adjustment of the mA/kV was utilized to reduce the radiation dose to as low as reasonably achievable. Stenosis of the internal carotid arteries measured using NASCET criteria. COMPARISON: None available CLINICAL HISTORY: Acute neuro deficit. Left arm and facial numbness. FINDINGS: CTA NECK: AORTIC ARCH AND ARCH VESSELS: No dissection or arterial injury. No significant stenosis of the brachiocephalic or subclavian arteries. CERVICAL CAROTID ARTERIES: Minimal calcified plaque in the right carotid bulb. No dissection, arterial injury, or hemodynamically significant stenosis by NASCET criteria. CERVICAL VERTEBRAL ARTERIES: Codominant vertebral arteries. Small amount of calcified plaque at the right vertebral artery origin  without a significant stenosis. No dissection or arterial injury. LUNGS AND MEDIASTINUM: Unremarkable. SOFT TISSUES: No acute abnormality. BONES: Mild cervical spondylosis. CTA HEAD: ANTERIOR CIRCULATION: The intracranial internal carotid arteries are widely patent. ACAs and MCAs are patent without evidence of a proximal branch occlusion or significant proximal stenosis. No aneurysm. POSTERIOR CIRCULATION: The intracranial vertebral arteries are widely patent to the basilar. Patent PICA and SCA origins are visualized bilaterally. The basilar artery is widely patent. There are moderate sized right and diminutive or absent left posterior communicating arteries. Both PCAs are patent with mild diffuse irregularity. There is a moderate left P2 stenosis. No aneurysm. OTHER: No dural venous sinus thrombosis on this non-dedicated study. IMPRESSION: 1. No large vessel occlusion. 2. Moderate left P2 stenosis. 3. No other evidence of a significant stenosis in the head or neck. 4. These results were communicated to Dr. KYM Ross at 2:17 PM on 02/08/2024 by secure text page via the [facility] messaging system. Electronically signed by: Dasie Hamburg MD 02/08/2024 02:29 PM EST RP Workstation: [id number]   CT HEAD CODE STROKE WO CONTRAST Result Date: 02/08/2024 EXAM: CT HEAD WITHOUT CONTRAST 02/08/2024 01:30:01 PM TECHNIQUE: CT of the head was performed without the administration of intravenous contrast. Automated exposure control, iterative reconstruction, and/or weight based adjustment of the mA/kV was utilized to reduce the radiation dose to as low as reasonably achievable. COMPARISON: Head CT 03/18/2004. CLINICAL HISTORY: Left upper extremity and facial numbness. FINDINGS: BRAIN AND VENTRICLES: There is no evidence of an acute infarct, intracranial hemorrhage, mass, midline shift, hydrocephalus, or extra-axial fluid collection. Cerebral volume is normal. ORBITS: No acute abnormality. SINUSES: Small volume, likely chronic mastoid  fluid bilaterally. Partially visualized mucosal thickening in the left maxillary sinus. SOFT TISSUES AND SKULL: No acute soft tissue abnormality. No skull fracture. [place] Stroke Program Early CT Score (ASPECTS) ----- Ganglionic (caudate, IC, lentiform nucleus, insula, M1-M3): 7 Supraganglionic (M4-M6): 3 Total: 10 IMPRESSION: 1. No acute intracranial abnormality. ASPECTS of 10. 2. These results were communicated to Dr. KYM Ross at 1:54 PM on February 08, 2024 by secure text page via the [facility] messaging system. Electronically signed by: Dasie Hamburg MD 02/08/2024 01:55 PM EST RP Workstation: [id number]    PROCEDURES and INTERVENTIONS:  .1-3 Lead EKG Interpretation  Performed by: Claudene Rover, MD Authorized by: Claudene Rover, MD     Interpretation: normal     ECG rate:  80   ECG rate assessment: normal     Rhythm: sinus rhythm     Ectopy: none     Conduction: normal   .Critical Care  Performed by: Claudene Rover, MD Authorized by: Claudene,  Ester, MD   Critical care provider statement:    Critical care time (minutes):  30   Critical care time was exclusive of:  Separately billable procedures and treating other patients   Critical care was necessary to treat or prevent imminent or life-threatening deterioration of the following conditions:  CNS failure or compromise   Critical care was time spent personally by me on the following activities:  Development of treatment plan with patient or surrogate, discussions with consultants, evaluation of patient's response to treatment, examination of patient, ordering and review of laboratory studies, ordering and review of radiographic studies, ordering and performing treatments and interventions, pulse oximetry, re-evaluation of patient's condition and review of old charts   Medications  sodium chloride  flush (NS) 0.9 % injection 3 mL (3 mLs Intravenous Given 02/08/24 1407)  iohexol  (OMNIPAQUE ) 350 MG/ML injection 75 mL (75 mLs Intravenous Contrast Given  02/08/24 1346)     IMPRESSION / MDM / ASSESSMENT AND PLAN / ED COURSE  I reviewed the triage vital signs and the nursing notes.  Differential diagnosis includes, but is not limited to, stroke, TIA, partial seizure, aortic emergency,  {Patient presents with symptoms of an acute illness or injury that is potentially life-threatening.  Patient presents with acute waxing and waning left-sided numbness over the past few hours.  Code stroke protocol was activated on arrival.  No ICH, no LVO.  NIH of 1.  Consult with neurology.  Mild hyperglycemia and pseudohyponatremia.  Normal CBC.  No signs of DKA.  No chest or back discomfort, doubt aortic pathology.\  Per neurology recommendations, we will hold in the ER until about 4:30 PM (TNK window) and monitor closely.  Plan to admit after this for stroke workup.  Clinical Course as of 02/08/24 1527  Tue Feb 08, 2024  1411 I consult with Dr. Matthews.  Due to continued mild symptoms and NIH of 1, can hold in the ED until 4:30 PM to ensure observation through the TNK window.  If symptoms resolve can be admitted for stroke workup.  If symptoms worsen reach out to neurology to see if TNK would be an option [DS]    Clinical Course User Index [DS] Claudene Ester, MD     FINAL CLINICAL IMPRESSION(S) / ED DIAGNOSES   Final diagnoses:  Left sided numbness     Rx / DC Orders   ED Discharge Orders     None        Note:  This document was prepared using Dragon voice recognition software and may include unintentional dictation errors.   Claudene Ester, MD 02/08/24 [phone]

## 2024-02-08 NOTE — Progress Notes (Signed)
   02/08/24 1325  Spiritual Encounters  Type of Visit Initial  Care provided to: Pt and family  Referral source Code page  Reason for visit Code  OnCall Visit Yes  Interventions  Spiritual Care Interventions Made Established relationship of care and support;Compassionate presence (Wife at bedside)  Intervention Outcomes  Outcomes Connection to spiritual care;Awareness of support  Spiritual Care Plan  Spiritual Care Issues Still Outstanding No further spiritual care needs at this time (see row info)

## 2024-02-08 NOTE — ED Notes (Signed)
 At this moment patient feels like his arm is better. He states he is still feeling some numbness in his left lip and left check. Still voicing pressure behind his left eye.

## 2024-02-09 ENCOUNTER — Telehealth (HOSPITAL_COMMUNITY): Payer: Self-pay

## 2024-02-09 ENCOUNTER — Other Ambulatory Visit (HOSPITAL_COMMUNITY): Payer: Self-pay

## 2024-02-09 ENCOUNTER — Observation Stay: Admit: 2024-02-09 | Discharge: 2024-02-09 | Disposition: A | Attending: Family Medicine

## 2024-02-09 DIAGNOSIS — G459 Transient cerebral ischemic attack, unspecified: Secondary | ICD-10-CM | POA: Diagnosis not present

## 2024-02-09 DIAGNOSIS — I10 Essential (primary) hypertension: Secondary | ICD-10-CM | POA: Diagnosis not present

## 2024-02-09 LAB — ECHOCARDIOGRAM COMPLETE BUBBLE STUDY
AR max vel: 2.65 cm2
AV Area VTI: 2.75 cm2
AV Area mean vel: 2.61 cm2
AV Mean grad: 2 mmHg
AV Peak grad: 3.6 mmHg
Ao pk vel: 0.95 m/s
Area-P 1/2: 3.23 cm2
MV VTI: 2.75 cm2
S' Lateral: 3.3 cm

## 2024-02-09 LAB — GLUCOSE, CAPILLARY
Glucose-Capillary: 245 mg/dL — ABNORMAL HIGH (ref 70–99)
Glucose-Capillary: 315 mg/dL — ABNORMAL HIGH (ref 70–99)

## 2024-02-09 LAB — HEMOGLOBIN A1C
Hgb A1c MFr Bld: 10 % — ABNORMAL HIGH (ref 4.8–5.6)
Mean Plasma Glucose: 240 mg/dL

## 2024-02-09 MED ORDER — CLOPIDOGREL BISULFATE 75 MG PO TABS: 75.0000 mg | ORAL_TABLET | Freq: Every day | ORAL | 0 refills | Status: AC

## 2024-02-09 MED ORDER — ATORVASTATIN CALCIUM 80 MG PO TABS
80.0000 mg | ORAL_TABLET | Freq: Every day | ORAL | Status: DC
  Filled 2024-02-09: qty 1

## 2024-02-09 NOTE — Inpatient Diabetes Management (Signed)
 Inpatient Diabetes Program Recommendations  AACE/ADA: New Consensus Statement on Inpatient Glycemic Control (2015)  Target Ranges:  Prepandial:   less than 140 mg/dL      Peak postprandial:   less than 180 mg/dL (1-2 hours)      Critically ill patients:  140 - 180 mg/dL   Lab Results  Component Value Date   GLUCAP 315 (H) 02/09/2024   HGBA1C 7.2 (H) 09/23/2023    Latest Reference Range & Units 02/08/24 13:15 02/08/24 19:53 02/09/24 08:16 02/09/24 12:05  Glucose-Capillary 70 - 99 mg/dL 753 (H) 747 (H) 754 (H) 315 (H)  (H): Data is abnormally high  Diabetes history: DM2 Outpatient Diabetes medications:  Glyxambi (not taken x one month due to cost) Metformin  1 gm bid Current orders for Inpatient glycemic control: Novolog 0-9 units tid, 0-5 units hs correction  Inpatient Diabetes Program Recommendations:   Requested pharmacy to review cost of Glyxambi along with separate medications of Jardiance and Tradjenta which may cost less than combined medication. While off of oral meds, please consider: -Semglee 18 units daily (0.2 units/kg x 93 kg = 18.6 units)  Thank you, Avion Kutzer E. Terrence Wishon, RN, MSN, CNS, CDCES  Diabetes Coordinator Inpatient Glycemic Control Team Team Pager [phone] (8am-5pm) 02/09/2024 12:30 PM

## 2024-02-09 NOTE — Evaluation (Addendum)
 Physical Therapy Evaluation Patient Details Name: Bryan Clark MRN: [id number] DOB: [birth year] Today's Date: 02/09/2024  History of Present Illness  Bryan Clark is a 53 y.o. male who presented to ED on 12/2 with sx of R CVA. Determined TNK not needed d/t mildness of sx. PMH significant of HTN, HLD, cardiomyopathy, and melanoma.  Clinical Impression  Pt is a very pleasant 52 y.o. male admitted for CVA-like sx on 12/2. Pt was noted to have L sided UE weakness and left-sided facial numbness upon admission but has seemingly gained back full function upon PT eval. Pt was received in supine with wife at bedside. He was able to complete ALL mobility, including t/f, amb, and 5 stairs independently without use of AD. Per pt he is at his baseline functioning-- he does not require further PT services. Will sign off.        If plan is discharge home, recommend the following: A little help with walking and/or transfers   Can travel by private vehicle        Equipment Recommendations None recommended by PT  Recommendations for Other Services       Functional Status Assessment Patient has not had a recent decline in their functional status     Precautions / Restrictions Precautions Precautions: None Restrictions Weight Bearing Restrictions Per Provider Order: No      Mobility  Bed Mobility Overal bed mobility: Independent             General bed mobility comments: no concern -- no vs, no assist, no increased time to complete    Transfers Overall transfer level: Independent Equipment used: None               General transfer comment: STS shows no concern; no AD use    Ambulation/Gait Ambulation/Gait assistance: Independent Gait Distance (Feet): 350 Feet Assistive device: None Gait Pattern/deviations: Step-through pattern       General Gait Details: 350 ft without AD and supervision; per pt this is how he was moving before coming to hospital  Stairs Stairs:  Yes Stairs assistance: Independent Stair Management: One rail Right, One rail Left, Alternating pattern Number of Stairs: 5 General stair comments: R rail ascending/ L rail descending as he does at home; normal reciprocal pattern; supervision; per pt this is how he performs stairs normally  Wheelchair Mobility     Tilt Bed    Modified Rankin (Stroke Patients Only)       Balance Overall balance assessment: Independent                                           Pertinent Vitals/Pain Pain Assessment Pain Assessment: No/denies pain    Home Living Family/patient expects to be discharged to:: Private residence Living Arrangements: Spouse/significant other Available Help at Discharge: Family Type of Home: Apartment Home Access: Stairs to enter Entrance Stairs-Rails: Right Entrance Stairs-Number of Steps: flight   Home Layout: One level Home Equipment: None      Prior Function Prior Level of Function : Independent/Modified Independent             Mobility Comments: IND at baseline ADLs Comments: IND at baseline     Extremity/Trunk Assessment   Upper Extremity Assessment Upper Extremity Assessment: Overall WFL for tasks assessed    Lower Extremity Assessment Lower Extremity Assessment: Overall WFL for tasks assessed    Cervical /  Trunk Assessment Cervical / Trunk Assessment: Normal  Communication   Communication Communication: No apparent difficulties    Cognition Arousal: Alert Behavior During Therapy: WFL for tasks assessed/performed   PT - Cognitive impairments: No apparent impairments                         Following commands: Intact       Cueing Cueing Techniques: Verbal cues     General Comments      Exercises Other Exercises Other Exercises: edu to pt and wife of BE FAST d/t stroke-like sx and being aware of them in the future Other Exercises: finger-nose, heel-shin for coordination testing - symmetrical    Assessment/Plan    PT Assessment Patient does not need any further PT services  PT Problem List         PT Treatment Interventions      PT Goals (Current goals can be found in the Care Plan section)  Acute Rehab PT Goals Patient Stated Goal: none stated PT Goal Formulation: With patient Time For Goal Achievement: 02/23/24 Potential to Achieve Goals: Good    Frequency       Co-evaluation               AM-PAC PT 6 Clicks Mobility  Outcome Measure Help needed turning from your back to your side while in a flat bed without using bedrails?: None Help needed moving from lying on your back to sitting on the side of a flat bed without using bedrails?: None Help needed moving to and from a bed to a chair (including a wheelchair)?: None Help needed standing up from a chair using your arms (e.g., wheelchair or bedside chair)?: None Help needed to walk in hospital room?: None Help needed climbing 3-5 steps with a railing? : None 6 Click Score: 24    End of Session   Activity Tolerance: Patient tolerated treatment well Patient left: in bed;with family/visitor present;with call bell/phone within reach (sitting EOB) Nurse Communication: Mobility status PT Visit Diagnosis: Other abnormalities of gait and mobility (R26.89)    Time: 9088-9066 PT Time Calculation (min) (ACUTE ONLY): 22 min   Charges:                 Allena Bulls, SPT  Maryanne Finder, PT, DPT Physical Therapist - [facility]  [facility] 02/09/2024, 10:48 AM

## 2024-02-09 NOTE — Progress Notes (Signed)
 OT Cancellation Note  Patient Details Name: Bryan Clark MRN: [id number] DOB: 23-May-1970   Cancelled Treatment:    Reason Eval/Treat Not Completed: OT screened, no needs identified, will sign off;Other (comment) (per PT student, no needs)  [name] Clause 02/09/2024, 9:56 AM

## 2024-02-09 NOTE — Plan of Care (Signed)
  Problem: Education: Goal: Knowledge of disease or condition will improve Outcome: Progressing Goal: Knowledge of secondary prevention will improve (MUST DOCUMENT ALL) Outcome: Progressing Goal: Knowledge of patient specific risk factors will improve (DELETE if not current risk factor) Outcome: Progressing   Problem: Ischemic Stroke/TIA Tissue Perfusion: Goal: Complications of ischemic stroke/TIA will be minimized Outcome: Progressing   Problem: Coping: Goal: Will verbalize positive feelings about self Outcome: Progressing Goal: Will identify appropriate support needs Outcome: Progressing   Problem: Health Behavior/Discharge Planning: Goal: Ability to manage health-related needs will improve Outcome: Progressing Goal: Goals will be collaboratively established with patient/family Outcome: Progressing   Problem: Self-Care: Goal: Ability to participate in self-care as condition permits will improve Outcome: Progressing Goal: Verbalization of feelings and concerns over difficulty with self-care will improve Outcome: Progressing Goal: Ability to communicate needs accurately will improve Outcome: Progressing   Problem: Nutrition: Goal: Risk of aspiration will decrease Outcome: Progressing Goal: Dietary intake will improve Outcome: Progressing   Problem: Education: Goal: Knowledge of General Education information will improve Description: Including pain rating scale, medication(s)/side effects and non-pharmacologic comfort measures Outcome: Progressing   Problem: Health Behavior/Discharge Planning: Goal: Ability to manage health-related needs will improve Outcome: Progressing   Problem: Clinical Measurements: Goal: Ability to maintain clinical measurements within normal limits will improve Outcome: Progressing Goal: Will remain free from infection Outcome: Progressing Goal: Diagnostic test results will improve Outcome: Progressing Goal: Respiratory complications will  improve Outcome: Progressing Goal: Cardiovascular complication will be avoided Outcome: Progressing   Problem: Activity: Goal: Risk for activity intolerance will decrease Outcome: Progressing   Problem: Nutrition: Goal: Adequate nutrition will be maintained Outcome: Progressing   Problem: Coping: Goal: Level of anxiety will decrease Outcome: Progressing   Problem: Elimination: Goal: Will not experience complications related to bowel motility Outcome: Progressing Goal: Will not experience complications related to urinary retention Outcome: Progressing   Problem: Pain Managment: Goal: General experience of comfort will improve and/or be controlled Outcome: Progressing   Problem: Safety: Goal: Ability to remain free from injury will improve Outcome: Progressing   Problem: Skin Integrity: Goal: Risk for impaired skin integrity will decrease Outcome: Progressing   Problem: Education: Goal: Ability to describe self-care measures that may prevent or decrease complications (Diabetes Survival Skills Education) will improve Outcome: Progressing Goal: Individualized Educational Video(s) Outcome: Progressing   Problem: Coping: Goal: Ability to adjust to condition or change in health will improve Outcome: Progressing   Problem: Fluid Volume: Goal: Ability to maintain a balanced intake and output will improve Outcome: Progressing   Problem: Metabolic: Goal: Ability to maintain appropriate glucose levels will improve Outcome: Progressing   Problem: Nutritional: Goal: Maintenance of adequate nutrition will improve Outcome: Progressing Goal: Progress toward achieving an optimal weight will improve Outcome: Progressing   Problem: Skin Integrity: Goal: Risk for impaired skin integrity will decrease Outcome: Progressing   Problem: Tissue Perfusion: Goal: Adequacy of tissue perfusion will improve Outcome: Progressing

## 2024-02-09 NOTE — Telephone Encounter (Signed)
 Pharmacy Patient Advocate Encounter  Insurance verification completed.    The patient is insured through [employer]. Patient has [employer], may use a copay card, and/or apply for patient assistance if available.    Ran test claim for Glyxambi 25-5mg  tablet and the current 30 day co-pay is $50.  Ran test claim for Jardiance 25mg  and the current 30 day co-pay is $50.  Ran test claim for Tradjenta 5mg  and it requires a PA.   This test claim was processed through [facility]- copay amounts may vary at other pharmacies due to pharmacy/plan contracts, or as the patient moves through the different stages of their insurance plan.

## 2024-02-09 NOTE — TOC CM/SW Note (Signed)
 Transition of Care [facility]) - Inpatient Brief Assessment   Patient Details  Name: Bryan Clark MRN: [id number] Date of Birth: 1971-03-05  Transition of Care [facility]) CM/SW Contact:    Daved JONETTA Hamilton, RN Phone Number: 02/09/2024, 2:54 PM   Clinical Narrative:   Transition of Care [facility]) Screening Note   Patient Details  Name: Bryan Clark Date of Birth: 1970/06/28   Transition of Care [facility]) CM/SW Contact:    Daved JONETTA Hamilton, RN Phone Number: 02/09/2024, 2:54 PM    Transition of Care Department [facility]) has reviewed patient and no TOC needs have been identified at this time. If new patient transition needs arise, please place a TOC consult.    Transition of Care Asessment: Insurance and Status: Insurance coverage has been reviewed Patient has primary care physician: Yes   Prior level of function:: Independent Prior/Current Home Services: No current home services Social Drivers of Health Review: SDOH reviewed no interventions necessary Readmission risk has been reviewed: No (patient in observation status, no score generated) Transition of care needs: no transition of care needs at this time

## 2024-02-09 NOTE — Discharge Summary (Signed)
 Physician Discharge Summary   Patient: Bryan Clark MRN: [id number] DOB: 1970/10/05  Admit date:     02/08/2024  Discharge date: 02/09/24  Discharge Physician: Drue ONEIDA Potter   PCP: Edman Meade PEDLAR, FNP   Recommendations at discharge:  Follow-up with outpatient neurology  Discharge Diagnoses:  Facial numbness, vision disturbance Hypertension Hyperlipidemia Type 2 diabetes hyperglycemia History of Melanoma Combined heart failure   Hospital Course:  From HPI Bryan Clark is a 53 y.o. male with medical history significant of nonischemic cardiomyopathy, hypertension, hyperlipidemia, melanoma stage Ib (negative sentinel node). Patient presented to the ED due to an episode of facial flushing, sensation changes in the left, and left-sided blurry vision.  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 Code stroke was called. Initial head CT was unremarkable.  Lab evaluation unremarkable with the exception of hyperglycemia.  TNK was not administered due to symptoms being too mild to treat. Symptoms have been waxing and waning while in the ED.  Vitals have remained stable. Patient was recommended to admit to hospitalist service for continued monitoring on telemetry.     At the time my evaluation patient is asymptomatic. When discussing his chronic conditions he reports he has been adherent to all medications except for his diabetes pills which he ran out of a month ago and has a difficulty getting a refill for.    Assessment and plan it is detailed below    Facial numbness, vision disturbance Acute stroke ruled out Transient ischemic attack Continue dual antiplatelet therapy for 21 days and then aspirin  monotherapy   Hypertension Continue antihypertensives   Hyperlipidemia Continue atorvastatin    Type 2 diabetes hyperglycemia - Taking metformin , empagliflozin , linagliptin , at home.  Has recently been out of  linagliptin  and empagliflozin . - Initiate sliding scale insulin  for now - Hemoglobin A1c ordered and pending   History of Melanoma - Sentinel lymph node biopsy negative - Follows outpatient with dermatology and oncology.  No active treatment   Combined heart failure Continue home medications  Consultants: Neurology Procedures performed: None Disposition: None Diet recommendation:  Cardiac diet DISCHARGE MEDICATION: Allergies as of 02/09/2024       Reactions   Bee Venom Anaphylaxis        Medication List     TAKE these medications    aspirin  EC 81 MG tablet Take 81 mg by mouth in the morning and at bedtime.   atorvastatin  40 MG tablet Commonly known as: LIPITOR  Take 1 tablet (40 mg total) by mouth daily.   carvedilol  12.5 MG tablet Commonly known as: COREG  TAKE 1 AND 1/2 TABLETS BY MOUTH TWICE DAILY WITH MEALS.   clopidogrel  75 MG tablet Commonly known as: Plavix  Take 1 tablet (75 mg total) by mouth daily for 21 days.   colesevelam  625 MG tablet Commonly known as: WELCHOL  Take 1,875 mg by mouth 2 (two) times daily.   cyanocobalamin 1000 MCG tablet Commonly known as: VITAMIN B12 Take 1,000 mcg by mouth daily.   Empagliflozin -linaGLIPtin  25-5 MG Tabs Take 25 mg by mouth daily.   EPINEPHrine  0.3 mg/0.3 mL Soaj injection Commonly known as: EpiPen  2-Pak Inject 0.3 mLs (0.3 mg total) into the muscle as needed for anaphylaxis (As need for bee stings).   fenofibrate  145 MG tablet Commonly known as: Tricor  Take 1 tablet (145 mg total) by mouth daily.   furosemide  20 MG tablet Commonly known as: LASIX  Take 1 tablet (20 mg total) by mouth  daily.   icosapent  Ethyl 1 g capsule Commonly known as: Vascepa  Take 2 capsules (2 g total) by mouth 2 (two) times daily.   lisinopril  10 MG tablet Commonly known as: ZESTRIL  TAKE ONE TABLET BY MOUTH ONCE DAILY.   metFORMIN  500 MG 24 hr tablet Commonly known as: GLUCOPHAGE -XR Take 1,000 mg by mouth 2 (two) times  daily.   multivitamin with minerals Tabs tablet Take 1 tablet by mouth daily.   nitroGLYCERIN 0.4 MG SL tablet Commonly known as: NITROSTAT Place 0.4 mg under the tongue as needed.   spironolactone  25 MG tablet Commonly known as: ALDACTONE  Take 1 tablet (25 mg total) by mouth daily.   terbinafine 250 MG tablet Commonly known as: LAMISIL Take 250 mg by mouth daily.   vitamin C 1000 MG tablet Take 1,000 mg by mouth daily.   Vitamin D -3 125 MCG (5000 UT) Tabs Take 1 tablet by mouth daily.        Follow-up Information     Bacchus, Meade PEDLAR, FNP Follow up.   Specialty: Family Medicine Why: hospital follow up Contact information: [address] [place] [address] [phone]                Discharge Exam: Bryan Clark   02/08/24 1314 02/08/24 1951  Weight: 90.7 kg 93 kg   Constitutional:  Normal appearance. Non toxic-appearing.  HENT: Head Normocephalic and atraumatic.  Mucous membranes are moist.  Eyes:  Extraocular intact. Conjunctivae normal. Pupils are equal, round, and reactive to light.  Cardiovascular: Rate and Rhythm: Normal rate and regular rhythm.  Pulmonary: Non labored, symmetric rise of chest wall.  Musculoskeletal:  Normal range of motion.  Skin: warm and dry. not jaundiced.  Neurological: 5/5 strength upper and lower extremity bilaterally, alert and oriented x 4 Psychiatric: Mood and Affect congruent.   Condition at discharge: good  The results of significant diagnostics from this hospitalization (including imaging, microbiology, ancillary and laboratory) are listed below for reference.   Imaging Studies: ECHOCARDIOGRAM COMPLETE BUBBLE STUDY Result Date: 02/09/2024    ECHOCARDIOGRAM REPORT   Patient Name:   Bryan Clark Date of Exam: 02/09/2024 Medical Rec #:  [id number]        Height:       69.0 in Accession #:    [id number]       Weight:       205.0 lb Date of Birth:  08-26-1970         BSA:          2.088 m Patient Age:    53 years          BP:           129/84 mmHg Patient Gender: M                HR:           82 bpm. Exam Location:  [facility] Procedure: 2D Echo, Cardiac Doppler, Color Doppler and Saline Contrast Bubble            Study (Both Spectral and Color Flow Doppler were utilized during            procedure). Indications:     Stroke 434.91 / I63.9  History:         Patient has prior history of Echocardiogram examinations, most                  recent 01/26/2022. Cardiomyopathy; Risk Factors:Hypertension.  Sonographer:     Christopher  Hege Referring Phys:  [id number] ALEXANDRA DEZII Diagnosing Phys: Timothy Gollan MD IMPRESSIONS  1. Left ventricular ejection fraction, by estimation, is 60 to 65%. The left ventricle has normal function. The left ventricle has no regional wall motion abnormalities. Left ventricular diastolic parameters are consistent with Grade I diastolic dysfunction (impaired relaxation).  2. Right ventricular systolic function is normal. The right ventricular size is normal.  3. The mitral valve is normal in structure. No evidence of mitral valve regurgitation. No evidence of mitral stenosis.  4. The aortic valve is normal in structure. Aortic valve regurgitation is not visualized. No aortic stenosis is present.  5. The inferior vena cava is normal in size with greater than 50% respiratory variability, suggesting right atrial pressure of 3 mmHg.  6. Agitated saline contrast bubble study was negative, with no evidence of any interatrial shunt. FINDINGS  Left Ventricle: Left ventricular ejection fraction, by estimation, is 60 to 65%. The left ventricle has normal function. The left ventricle has no regional wall motion abnormalities. Strain was performed and the global longitudinal strain is indeterminate. The left ventricular internal cavity size was normal in size. There is no left ventricular hypertrophy. Left ventricular diastolic parameters are consistent with Grade I diastolic dysfunction (impaired relaxation). Right Ventricle:  The right ventricular size is normal. No increase in right ventricular wall thickness. Right ventricular systolic function is normal. Left Atrium: Left atrial size was normal in size. Right Atrium: Right atrial size was normal in size. Pericardium: There is no evidence of pericardial effusion. Mitral Valve: The mitral valve is normal in structure. No evidence of mitral valve regurgitation. No evidence of mitral valve stenosis. MV peak gradient, 3.2 mmHg. The mean mitral valve gradient is 2.0 mmHg. Tricuspid Valve: The tricuspid valve is normal in structure. Tricuspid valve regurgitation is not demonstrated. No evidence of tricuspid stenosis. Aortic Valve: The aortic valve is normal in structure. Aortic valve regurgitation is not visualized. No aortic stenosis is present. Aortic valve mean gradient measures 2.0 mmHg. Aortic valve peak gradient measures 3.6 mmHg. Aortic valve area, by VTI measures 2.75 cm. Pulmonic Valve: The pulmonic valve was normal in structure. Pulmonic valve regurgitation is not visualized. No evidence of pulmonic stenosis. Aorta: The aortic root is normal in size and structure. Venous: The inferior vena cava is normal in size with greater than 50% respiratory variability, suggesting right atrial pressure of 3 mmHg. IAS/Shunts: No atrial level shunt detected by color flow Doppler. Agitated saline contrast was given intravenously to evaluate for intracardiac shunting. Agitated saline contrast bubble study was negative, with no evidence of any interatrial shunt. There  is no evidence of a patent foramen ovale. There is no evidence of an atrial septal defect. Additional Comments: 3D was performed not requiring image post processing on an independent workstation and was indeterminate.  LEFT VENTRICLE PLAX 2D LVIDd:         4.80 cm   Diastology LVIDs:         3.30 cm   LV e' medial:    5.44 cm/s LV PW:         1.10 cm   LV E/e' medial:  8.5 LV IVS:        1.00 cm   LV e' lateral:   5.66 cm/s LVOT  diam:     2.30 cm   LV E/e' lateral: 8.1 LV SV:         48 LV SV Index:   23 LVOT Area:     4.15 cm  LV IVRT:       66 msec  RIGHT VENTRICLE RV Basal diam:  3.80 cm     PULMONARY VEINS RV Mid diam:    3.40 cm     Diastolic Velocity: 35.20 cm/s RV S prime:     12.70 cm/s  S/D Velocity:       2.00 TAPSE (M-mode): 2.2 cm      Systolic Velocity:  69.10 cm/s LEFT ATRIUM             Index        RIGHT ATRIUM           Index LA diam:        2.90 cm 1.39 cm/m   RA Area:     18.70 cm LA Vol (A2C):   37.6 ml 18.01 ml/m  RA Volume:   57.30 ml  27.44 ml/m LA Vol (A4C):   37.2 ml 17.82 ml/m LA Biplane Vol: 37.9 ml 18.15 ml/m  AORTIC VALVE AV Area (Vmax):    2.65 cm AV Area (Vmean):   2.61 cm AV Area (VTI):     2.75 cm AV Vmax:           94.60 cm/s AV Vmean:          64.200 cm/s AV VTI:            0.175 m AV Peak Grad:      3.6 mmHg AV Mean Grad:      2.0 mmHg LVOT Vmax:         60.30 cm/s LVOT Vmean:        40.400 cm/s LVOT VTI:          0.116 m LVOT/AV VTI ratio: 0.66  AORTA Ao Root diam: 3.80 cm MITRAL VALVE               TRICUSPID VALVE MV Area (PHT): 3.23 cm    TR Peak grad:   13.8 mmHg MV Area VTI:   2.75 cm    TR Vmax:        186.00 cm/s MV Peak grad:  3.2 mmHg MV Mean grad:  2.0 mmHg    SHUNTS MV Vmax:       0.90 m/s    Systemic VTI:  0.12 m MV Vmean:      59.4 cm/s   Systemic Diam: 2.30 cm MV Decel Time: 235 msec MV E velocity: 46.10 cm/s MV A velocity: 85.40 cm/s MV E/A ratio:  0.54 Evalene Lunger MD Electronically signed by Evalene Lunger MD Signature Date/Time: 02/09/2024/11:52:40 AM    Final    MR BRAIN WO CONTRAST Result Date: 02/08/2024 EXAM: MRI BRAIN WITHOUT CONTRAST 02/08/2024 08:57:46 PM TECHNIQUE: Multiplanar multisequence MRI of the head/brain was performed without the administration of intravenous contrast. COMPARISON: None available. CLINICAL HISTORY: Neuro deficit, acute, stroke suspected; left facial numbness and vision disturbance. FINDINGS: BRAIN AND VENTRICLES: Minimal nonspecific white  matter T2-weighted signal hyperintensities, which may be associated with early chronic small vessel disease or migraine headaches. No acute infarct. No intracranial hemorrhage. No mass. No midline shift. No hydrocephalus. The sella is unremarkable. Normal flow voids. ORBITS: No acute abnormality. SINUSES AND MASTOIDS: Left maxillary sinus mucosal thickening. A small amount of bilateral mastoid fluid. BONES AND SOFT TISSUES: Normal marrow signal. No acute soft tissue abnormality. IMPRESSION: 1. No acute intracranial abnormality. 2. Minimal nonspecific white matter T2-weighted signal hyperintensities, possibly related to early chronic small vessel disease or migraine headaches. 3. Left maxillary sinus mucosal thickening and  a small amount of bilateral mastoid fluid. Electronically signed by: Franky Stanford MD 02/08/2024 09:47 PM EST RP Workstation: [id number]   CT ANGIO HEAD NECK W WO CM Result Date: 02/08/2024 EXAM: CTA HEAD AND NECK WITHOUT AND WITH 02/08/2024 01:57:28 PM TECHNIQUE: CTA of the head and neck was performed without and with the administration of 75 mL of iohexol  (OMNIPAQUE ) 350 MG/ML injection. Multiplanar 2D and/or 3D reformatted images are provided for review. Automated exposure control, iterative reconstruction, and/or weight based adjustment of the mA/kV was utilized to reduce the radiation dose to as low as reasonably achievable. Stenosis of the internal carotid arteries measured using NASCET criteria. COMPARISON: None available CLINICAL HISTORY: Acute neuro deficit. Left arm and facial numbness. FINDINGS: CTA NECK: AORTIC ARCH AND ARCH VESSELS: No dissection or arterial injury. No significant stenosis of the brachiocephalic or subclavian arteries. CERVICAL CAROTID ARTERIES: Minimal calcified plaque in the right carotid bulb. No dissection, arterial injury, or hemodynamically significant stenosis by NASCET criteria. CERVICAL VERTEBRAL ARTERIES: Codominant vertebral arteries. Small amount of  calcified plaque at the right vertebral artery origin without a significant stenosis. No dissection or arterial injury. LUNGS AND MEDIASTINUM: Unremarkable. SOFT TISSUES: No acute abnormality. BONES: Mild cervical spondylosis. CTA HEAD: ANTERIOR CIRCULATION: The intracranial internal carotid arteries are widely patent. ACAs and MCAs are patent without evidence of a proximal branch occlusion or significant proximal stenosis. No aneurysm. POSTERIOR CIRCULATION: The intracranial vertebral arteries are widely patent to the basilar. Patent PICA and SCA origins are visualized bilaterally. The basilar artery is widely patent. There are moderate sized right and diminutive or absent left posterior communicating arteries. Both PCAs are patent with mild diffuse irregularity. There is a moderate left P2 stenosis. No aneurysm. OTHER: No dural venous sinus thrombosis on this non-dedicated study. IMPRESSION: 1. No large vessel occlusion. 2. Moderate left P2 stenosis. 3. No other evidence of a significant stenosis in the head or neck. 4. These results were communicated to Dr. KYM Ross at 2:17 PM on 02/08/2024 by secure text page via the [facility] messaging system. Electronically signed by: Dasie Hamburg MD 02/08/2024 02:29 PM EST RP Workstation: [id number]   CT HEAD CODE STROKE WO CONTRAST Result Date: 02/08/2024 EXAM: CT HEAD WITHOUT CONTRAST 02/08/2024 01:30:01 PM TECHNIQUE: CT of the head was performed without the administration of intravenous contrast. Automated exposure control, iterative reconstruction, and/or weight based adjustment of the mA/kV was utilized to reduce the radiation dose to as low as reasonably achievable. COMPARISON: Head CT 03/18/2004. CLINICAL HISTORY: Left upper extremity and facial numbness. FINDINGS: BRAIN AND VENTRICLES: There is no evidence of an acute infarct, intracranial hemorrhage, mass, midline shift, hydrocephalus, or extra-axial fluid collection. Cerebral volume is normal. ORBITS: No acute  abnormality. SINUSES: Small volume, likely chronic mastoid fluid bilaterally. Partially visualized mucosal thickening in the left maxillary sinus. SOFT TISSUES AND SKULL: No acute soft tissue abnormality. No skull fracture. [place] Stroke Program Early CT Score (ASPECTS) ----- Ganglionic (caudate, IC, lentiform nucleus, insula, M1-M3): 7 Supraganglionic (M4-M6): 3 Total: 10 IMPRESSION: 1. No acute intracranial abnormality. ASPECTS of 10. 2. These results were communicated to Dr. KYM Ross at 1:54 PM on February 08, 2024 by secure text page via the [facility] messaging system. Electronically signed by: Dasie Hamburg MD 02/08/2024 01:55 PM EST RP Workstation: [id number]    Microbiology: Results for orders placed or performed in visit on 02/07/19  Novel Coronavirus, NAA ([facility])     Status: Abnormal   Collection Time: 02/07/19  8:10 AM   Specimen: Nasopharyngeal(NP) swabs in  vial transport medium   NASOPHARYNGE  TESTING  Result Value Ref Range Status   SARS-CoV-2, NAA Detected (A) Not Detected Final    Comment: Testing was performed using the cobas(R) SARS-CoV-2 test. This nucleic acid amplification test was developed and its performance characteristics determined by [employer]. Nucleic acid amplification tests include PCR and TMA. This test has not been FDA cleared or approved. This test has been authorized by FDA under an Emergency Use Authorization (EUA). This test is only authorized for the duration of time the declaration that circumstances exist justifying the authorization of the emergency use of in vitro diagnostic tests for detection of SARS-CoV-2 virus and/or diagnosis of COVID-19 infection under section 564(b)(1) of the Act, 21 U.S.C. 639aaa-6(a) (1), unless the authorization is terminated or revoked sooner. When diagnostic testing is negative, the possibility of a false negative result should be considered in the context of a patient's recent exposures and the presence of clinical  signs and symptoms consistent with COVID-19. An individual without symptoms  of COVID-19 and who is not shedding SARS-CoV-2 virus would expect to have a negative (not detected) result in this assay.     Labs: CBC: Recent Labs  Lab 02/08/24 1324  WBC 5.9  NEUTROABS 3.4  HGB 14.6  HCT 43.1  MCV 92.5  PLT 159   Basic Metabolic Panel: Recent Labs  Lab 02/08/24 1324  NA 132*  K 4.0  CL 95*  CO2 23  GLUCOSE 275*  BUN 21*  CREATININE 1.14  CALCIUM  9.7   Liver Function Tests: Recent Labs  Lab 02/08/24 1324  AST 26  ALT 27  ALKPHOS 49  BILITOT 0.5  PROT 7.2  ALBUMIN 4.5   CBG: Recent Labs  Lab 02/08/24 1315 02/08/24 1953 02/09/24 0816 02/09/24 1205  GLUCAP 246* 252* 245* 315*    Discharge time spent:  37 minutes.  Signed: Drue ONEIDA Potter, MD [facility] 02/09/2024

## 2024-02-09 NOTE — Progress Notes (Signed)
*  PRELIMINARY RESULTS* Echocardiogram 2D Echocardiogram has been performed.  Bryan Clark 02/09/2024, 8:07 AM

## 2024-02-10 ENCOUNTER — Other Ambulatory Visit: Payer: Self-pay

## 2024-02-10 ENCOUNTER — Other Ambulatory Visit (HOSPITAL_COMMUNITY): Payer: Self-pay

## 2024-02-10 ENCOUNTER — Telehealth: Payer: Self-pay | Admitting: Internal Medicine

## 2024-02-10 ENCOUNTER — Other Ambulatory Visit: Payer: Self-pay | Admitting: Internal Medicine

## 2024-02-10 LAB — LDL CHOLESTEROL, DIRECT: Direct LDL: 84 mg/dL (ref 0–99)

## 2024-02-10 MED ORDER — ATORVASTATIN CALCIUM 40 MG PO TABS: 40.0000 mg | ORAL_TABLET | Freq: Every day | ORAL | 7 refills | Status: DC

## 2024-02-10 MED ORDER — EMPAGLIFLOZIN-LINAGLIPTIN 25-5 MG PO TABS
1.0000 | ORAL_TABLET | Freq: Every day | ORAL | 2 refills | Status: DC
  Filled 2024-02-10: qty 30, 30d supply, fill #0

## 2024-02-10 NOTE — Telephone Encounter (Signed)
*  STAT* If patient is at the pharmacy, call can be transferred to refill team.   1. Which medications need to be refilled? (please list name of each medication and dose if known) atorvastatin  (LIPITOR) 40 MG tablet    2. Would you like to learn more about the convenience, safety, & potential cost savings by using the [facility]?    3. Are you open to using the [facility] (Type [facility]. ).   4. Which pharmacy/location (including street and city if local pharmacy) is medication to be sent to? [facility] - [place], [place] - [address]    5. Do they need a 30 day or 90 day supply? 30 day

## 2024-02-10 NOTE — Telephone Encounter (Signed)
 Called pt back to verify what medication that pt was asking for a refill on.  Medication is correct.  Per pt and pt's wife, pt had to change PCP's due to Dr. Bertell retiring, pt's prescription, per Francina is now expired and needs a new prescription.  Pt does have  an appointment with a new PCP 03/21/24, but is out of this medication and sugar is running high.  They wanted to see if we can fill medication for pt until he sees his PCP.  I did advise I would have to ask and I would call them back and let them know.

## 2024-02-10 NOTE — Telephone Encounter (Signed)
*  STAT* If patient is at the pharmacy, call can be transferred to refill team.   1. Which medications need to be refilled? (please list name of each medication and dose if known) Empagliflozin-Linagliptin 25-5 MG TABS    2. Would you like to learn more about the convenience, safety, & potential cost savings by using the [facility]?     3. Are you open to using the [facility] (Type [facility].  ).   4. Which pharmacy/location (including street and city if local pharmacy) is medication to be sent to? [id number] - [place], [place] - [address]    5. Do they need a 30 day or 90 day supply? 30 day

## 2024-02-10 NOTE — Telephone Encounter (Signed)
 Requested Prescriptions   Signed Prescriptions Disp Refills   atorvastatin  (LIPITOR) 40 MG tablet 30 tablet 7    Sig: Take 1 tablet (40 mg total) by mouth daily.    Authorizing Provider: LAWRENCE, KATHRYN M    Ordering User: WILFRED, Yeng Perz  C

## 2024-02-10 NOTE — Telephone Encounter (Signed)
 Left a message for pt that Dr. Okey signed the refill and it was sent to our pharmacy @ [facility] [address]  Asked pt to call back if he had any further questions.

## 2024-02-11 MED ORDER — MIDAZOLAM HCL 2 MG/2ML IJ SOLN
INTRAMUSCULAR | Status: AC
  Filled 2024-02-11: qty 2

## 2024-02-11 MED ORDER — PROPOFOL 1000 MG/100ML IV EMUL
INTRAVENOUS | Status: AC
  Filled 2024-02-11: qty 100

## 2024-02-18 ENCOUNTER — Telehealth: Payer: Self-pay

## 2024-02-18 NOTE — Telephone Encounter (Signed)
 Copied from CRM [id number]. Topic: Appointments - Appointment Scheduling >> Feb 18, 2024  2:02 PM Santiya F wrote: Patient's wife is calling in because she received a call to reschedule an appointment with Meade Gerlach. Attempted to reschedule with Leita Longs, but the first available is in April and that it too long to wait for their preference. Patient's wife wanted to know could the first appointment be a virtual or if not, could they get a sooner appointment before April.

## 2024-02-25 ENCOUNTER — Ambulatory Visit: Attending: Student | Admitting: Student

## 2024-02-25 ENCOUNTER — Encounter: Payer: Self-pay | Admitting: Student

## 2024-02-25 VITALS — BP 100/60 | HR 95 | Ht 69.0 in | Wt 195.8 lb

## 2024-02-25 DIAGNOSIS — E119 Type 2 diabetes mellitus without complications: Secondary | ICD-10-CM

## 2024-02-25 DIAGNOSIS — G459 Transient cerebral ischemic attack, unspecified: Secondary | ICD-10-CM

## 2024-02-25 DIAGNOSIS — I5020 Unspecified systolic (congestive) heart failure: Secondary | ICD-10-CM | POA: Diagnosis not present

## 2024-02-25 DIAGNOSIS — I10 Essential (primary) hypertension: Secondary | ICD-10-CM

## 2024-02-25 DIAGNOSIS — E785 Hyperlipidemia, unspecified: Secondary | ICD-10-CM | POA: Diagnosis not present

## 2024-02-25 MED ORDER — LISINOPRIL 10 MG PO TABS: 10.0000 mg | ORAL_TABLET | Freq: Every day | ORAL | 3 refills | Status: AC

## 2024-02-25 MED ORDER — EMPAGLIFLOZIN-LINAGLIPTIN 25-5 MG PO TABS: 1.0000 | ORAL_TABLET | Freq: Every day | ORAL | 3 refills | Status: AC

## 2024-02-25 MED ORDER — FUROSEMIDE 20 MG PO TABS: 20.0000 mg | ORAL_TABLET | Freq: Every day | ORAL | 3 refills | Status: AC

## 2024-02-25 MED ORDER — CARVEDILOL 12.5 MG PO TABS: ORAL_TABLET | ORAL | 3 refills | Status: AC

## 2024-02-25 MED ORDER — SPIRONOLACTONE 25 MG PO TABS: 25.0000 mg | ORAL_TABLET | Freq: Every day | ORAL | 3 refills | Status: AC

## 2024-02-25 MED ORDER — METFORMIN HCL ER 500 MG PO TB24: 1000.0000 mg | ORAL_TABLET | Freq: Two times a day (BID) | ORAL | 3 refills | Status: AC

## 2024-02-25 MED ORDER — ATORVASTATIN CALCIUM 40 MG PO TABS: 40.0000 mg | ORAL_TABLET | Freq: Every day | ORAL | 1 refills | Status: AC

## 2024-02-25 NOTE — Progress Notes (Unsigned)
 "  Cardiology Office Note    Date:  02/26/2024  ID:  Bryan Clark, DOB 12-26-1970, MRN [id number] Cardiologist: Vina Gull, MD {  History of Present Illness:    Bryan Clark is a 53 y.o. male with past medical history of chronic HFimpEF/NICM (EF 20% by echocardiogram in 2012 with cardiac catheterization showing no significant CAD, EF at 45% by echocardiogram in 01/2022), HTN, HLD and Type II DM who presents to the office today for hospital follow-up.  He was examined by Dr. Gull in 09/2023 and reported occasional episodes of right-sided chest pain which was not associated with activity. No changes were made to his cardiac medications and he was continued on ASA 81 mg daily, Atorvastatin  40 mg daily, Coreg  18.75 mg twice daily, Jardiance  25 mg daily, Lasix  20 mg daily, Lisinopril  10 mg daily and Spironolactone  25 mg daily.  In the interim, he was admitted to [facility] from 12/2 - 02/09/2024 for evaluation of facial flushing and sensation changes along his left side. CT Head and Brain MRI showed no acute intracranial abnormalities.  Echocardiogram showed a preserved EF of 60 to 65% with grade 1 diastolic dysfunction, normal RV function and no significant valve abnormalities. Bubble study was negative. Symptoms were felt to possibly be due to a TIA and was recommended to be on DAPT with ASA and Plavix  for 21 days and then ASA as monotherapy.  In talking with the patient and his wife today, they report that he had been without several of his diabetic medications leading up to his recent hospitalization. Since then, he has resumed these and has made dietary changes as well. He does report having brief episodes the week following hospital discharge which resembled his initial symptoms with weakness along his arm and leg. No recurrence over the past week.  He is active with his job and denies any recent chest pain or dyspnea on exertion. No specific orthopnea, PND or pitting edema.  Studies Reviewed:    EKG: EKG is not ordered today.   Echocardiogram: 02/2024 IMPRESSIONS     1. Left ventricular ejection fraction, by estimation, is 60 to 65%. The  left ventricle has normal function. The left ventricle has no regional  wall motion abnormalities. Left ventricular diastolic parameters are  consistent with Grade I diastolic  dysfunction (impaired relaxation).   2. Right ventricular systolic function is normal. The right ventricular  size is normal.   3. The mitral valve is normal in structure. No evidence of mitral valve  regurgitation. No evidence of mitral stenosis.   4. The aortic valve is normal in structure. Aortic valve regurgitation is  not visualized. No aortic stenosis is present.   5. The inferior vena cava is normal in size with greater than 50%  respiratory variability, suggesting right atrial pressure of 3 mmHg.   6. Agitated saline contrast bubble study was negative, with no evidence  of any interatrial shunt.   Physical Exam:   VS:  BP 100/60 (BP Location: Right Arm, Cuff Size: Normal)   Pulse 95   Ht 5' 9 (1.753 m)   Wt 195 lb 12.8 oz (88.8 kg)   SpO2 96%   BMI 28.91 kg/m    Wt Readings from Last 3 Encounters:  02/25/24 195 lb 12.8 oz (88.8 kg)  02/08/24 205 lb 0.4 oz (93 kg)  09/23/23 205 lb (93 kg)     GEN: Well nourished, well developed in no acute distress NECK: No JVD; No carotid bruits CARDIAC: RRR,  no murmurs, rubs, gallops RESPIRATORY:  Clear to auscultation without rales, wheezing or rhonchi  ABDOMEN: Appears non-distended. No obvious abdominal masses. EXTREMITIES: No clubbing or cyanosis. No pitting edema.  Distal pedal pulses are 2+ bilaterally.   Assessment and Plan:   1. 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 Most recent echo showed his EF had  normalized to 60-65%. He appears euvolemic by examination today and denies any recent respiratory issues.  - Continue current medical therapy for now with Coreg  18.75 mg twice daily, Jardiance  (on diabetic dose), Lasix  20 mg daily, Lisinopril  10 mg daily and Spironolactone  25 mg daily. May need to reduce medical therapy based on BP readings as discussed below.  2. Essential hypertension - BP is soft at 100/60 during today's visit with similar value by recheck.He denies any associated symptoms. Provided them with a BP log and encouraged to return this in about 1 month.  - For now, will continue current medical therapy with Coreg  18.75mg  BID, Lasix  20mg  daily, Lisinopril  10mg  daily and Spironolactone  25mg  daily. If BP remains soft, would reduce Coreg  to 12.5mg  BID.   3. Hyperlipidemia LDL goal <70 - LDL was at 84 during recent admission but triglycerides were significantly elevated at 1013. He is making dietary changes. Continue current medical therapy with Atorvastatin  40mg  daily and Fenofibrate  145mg  daily. If LDL remains above goal, would titrate Atorvastatin  to 80mg  daily.   4. Type 2 diabetes mellitus without complication, without long-term current use of insulin  (HCC) - His Hgb A1c was 7.2 earlier this year but elevated to 10.0 during his recent admission as he had been without several of his diabetic medications due to his PCP retiring. Will provide refills today as he is not able to establish with his new PCP until 04/2024. Refills will need to come from them at that time. He was also encouraged to continue with dietary changes.   5. TIA (transient ischemic attack) - By review of notes, this was the presumed diagnosis during his recent admission. Was supposed to be referred to Neurology as an outpatient but they have not heard back in regards to this. Will enter a referral today.   Signed, Laymon CHRISTELLA Qua, PA-C   "

## 2024-02-25 NOTE — Patient Instructions (Signed)
 Medication Instructions:  Your physician recommends that you continue on your current medications as directed. Please refer to the Current Medication list given to you today.  1 Month blood pressure log  *If you need a refill on your cardiac medications before your next appointment, please call your pharmacy*  Lab Work: NONE   If you have labs (blood work) drawn today and your tests are completely normal, you will receive your results only by: MyChart Message (if you have MyChart) OR A paper copy in the mail If you have any lab test that is abnormal or we need to change your treatment, we will call you to review the results.  Testing/Procedures: NONE   Follow-Up: At [facility], you and your health needs are our priority.  As part of our continuing mission to provide you with exceptional heart care, our providers are all part of one team.  This team includes your primary Cardiologist (physician) and Advanced Practice Providers or APPs (Physician Assistants and Nurse Practitioners) who all work together to provide you with the care you need, when you need it.  Your next appointment:   6 month(s)  Provider:   You may see Vina Gull, MD or one of the following Advanced Practice Providers on your designated Care Team:   Laymon Qua, PA-C  [place], [place] Olivia [place], [place]     We recommend signing up for the patient portal called MyChart.  Sign up information is provided on this After Visit Summary.  MyChart is used to connect with patients for Virtual Visits (Telemedicine).  Patients are able to view lab/test results, encounter notes, upcoming appointments, etc.  Non-urgent messages can be sent to your provider as well.   To learn more about what you can do with MyChart, go to [email or web address].   Other Instructions Thank you for choosing [facility]!

## 2024-02-26 ENCOUNTER — Encounter: Payer: Self-pay | Admitting: Student

## 2024-02-29 ENCOUNTER — Other Ambulatory Visit (HOSPITAL_COMMUNITY): Payer: Self-pay

## 2024-02-29 ENCOUNTER — Other Ambulatory Visit (HOSPITAL_BASED_OUTPATIENT_CLINIC_OR_DEPARTMENT_OTHER): Payer: Self-pay

## 2024-02-29 MED ORDER — GLYXAMBI 25-5 MG PO TABS
1.0000 | ORAL_TABLET | Freq: Every day | ORAL | 3 refills | Status: AC
  Filled 2024-02-29: qty 30, 30d supply, fill #0
  Filled 2024-04-05: qty 30, 30d supply, fill #1

## 2024-03-16 LAB — OPHTHALMOLOGY REPORT-SCANNED

## 2024-03-21 ENCOUNTER — Ambulatory Visit: Payer: Self-pay | Admitting: Family Medicine

## 2024-04-05 ENCOUNTER — Other Ambulatory Visit (HOSPITAL_BASED_OUTPATIENT_CLINIC_OR_DEPARTMENT_OTHER): Payer: Self-pay

## 2024-04-11 ENCOUNTER — Ambulatory Visit

## 2024-04-11 VITALS — BP 117/79 | HR 84 | Temp 97.7°F | Ht 69.0 in | Wt 197.4 lb

## 2024-04-11 DIAGNOSIS — Z7984 Long term (current) use of oral hypoglycemic drugs: Secondary | ICD-10-CM

## 2024-04-11 DIAGNOSIS — B351 Tinea unguium: Secondary | ICD-10-CM | POA: Insufficient documentation

## 2024-04-11 DIAGNOSIS — I158 Other secondary hypertension: Secondary | ICD-10-CM

## 2024-04-11 DIAGNOSIS — R5383 Other fatigue: Secondary | ICD-10-CM

## 2024-04-11 DIAGNOSIS — E7800 Pure hypercholesterolemia, unspecified: Secondary | ICD-10-CM

## 2024-04-11 DIAGNOSIS — Z0000 Encounter for general adult medical examination without abnormal findings: Secondary | ICD-10-CM

## 2024-04-11 DIAGNOSIS — Z113 Encounter for screening for infections with a predominantly sexual mode of transmission: Secondary | ICD-10-CM

## 2024-04-11 DIAGNOSIS — C439 Malignant melanoma of skin, unspecified: Secondary | ICD-10-CM | POA: Insufficient documentation

## 2024-04-11 DIAGNOSIS — E119 Type 2 diabetes mellitus without complications: Secondary | ICD-10-CM

## 2024-04-11 DIAGNOSIS — I5020 Unspecified systolic (congestive) heart failure: Secondary | ICD-10-CM | POA: Insufficient documentation

## 2024-04-12 ENCOUNTER — Ambulatory Visit: Payer: Self-pay

## 2024-04-12 LAB — MICROALBUMIN / CREATININE URINE RATIO
Creatinine, Urine: 66.1 mg/dL
Microalb/Creat Ratio: <5 mg/g{creat} (ref 0–29)
Microalbumin, Urine: <3 ug/mL

## 2024-04-12 LAB — CBC WITH DIFFERENTIAL/PLATELET
Basophils Absolute: 0.1 10*3/uL (ref 0.0–0.2)
Basos: 1 %
EOS (ABSOLUTE): 0.2 10*3/uL (ref 0.0–0.4)
Eos: 3 %
Hematocrit: 48.8 % (ref 37.5–51.0)
Hemoglobin: 16 g/dL (ref 13.0–17.7)
Immature Grans (Abs): 0 10*3/uL (ref 0.0–0.1)
Immature Granulocytes: 0 %
Lymphocytes Absolute: 2 10*3/uL (ref 0.7–3.1)
Lymphs: 32 %
MCH: 31.3 pg (ref 26.6–33.0)
MCHC: 32.8 g/dL (ref 31.5–35.7)
MCV: 96 fL (ref 79–97)
Monocytes Absolute: 0.5 10*3/uL (ref 0.1–0.9)
Monocytes: 8 %
Neutrophils Absolute: 3.5 10*3/uL (ref 1.4–7.0)
Neutrophils: 56 %
Platelets: 166 10*3/uL (ref 150–450)
RBC: 5.11 x10E6/uL (ref 4.14–5.80)
RDW: 12.4 % (ref 11.6–15.4)
WBC: 6.2 10*3/uL (ref 3.4–10.8)

## 2024-04-12 LAB — COMPREHENSIVE METABOLIC PANEL WITH GFR
ALT: 30 [IU]/L (ref 0–44)
AST: 27 [IU]/L (ref 0–40)
Albumin: 4.7 g/dL (ref 3.8–4.9)
Alkaline Phosphatase: 46 [IU]/L — ABNORMAL LOW (ref 47–123)
BUN/Creatinine Ratio: 20 (ref 9–20)
BUN: 28 mg/dL — ABNORMAL HIGH (ref 6–24)
Bilirubin Total: 0.4 mg/dL (ref 0.0–1.2)
CO2: 23 mmol/L (ref 20–29)
Calcium: 10.5 mg/dL — ABNORMAL HIGH (ref 8.7–10.2)
Chloride: 100 mmol/L (ref 96–106)
Creatinine, Ser: 1.42 mg/dL — ABNORMAL HIGH (ref 0.76–1.27)
Globulin, Total: 2.9 g/dL (ref 1.5–4.5)
Glucose: 167 mg/dL — ABNORMAL HIGH (ref 70–99)
Potassium: 4.7 mmol/L (ref 3.5–5.2)
Sodium: 137 mmol/L (ref 134–144)
Total Protein: 7.6 g/dL (ref 6.0–8.5)
eGFR: 59 mL/min/{1.73_m2} — ABNORMAL LOW

## 2024-04-12 LAB — TSH+FREE T4
Free T4: 1.04 ng/dL (ref 0.82–1.77)
TSH: 0.751 u[IU]/mL (ref 0.450–4.500)

## 2024-04-12 LAB — LIPID PANEL
Chol/HDL Ratio: 7.2 ratio — ABNORMAL HIGH (ref 0.0–5.0)
Cholesterol, Total: 246 mg/dL — ABNORMAL HIGH (ref 100–199)
HDL: 34 mg/dL — ABNORMAL LOW
LDL Chol Calc (NIH): 89 mg/dL (ref 0–99)
Triglycerides: 744 mg/dL (ref 0–149)
VLDL Cholesterol Cal: 123 mg/dL — ABNORMAL HIGH (ref 5–40)

## 2024-04-12 LAB — HEPATITIS C ANTIBODY: Hep C Virus Ab: NONREACTIVE

## 2024-04-12 LAB — HEMOGLOBIN A1C
Est. average glucose Bld gHb Est-mCnc: 186 mg/dL
Hgb A1c MFr Bld: 8.1 % — ABNORMAL HIGH (ref 4.8–5.6)

## 2024-04-14 ENCOUNTER — Other Ambulatory Visit (HOSPITAL_BASED_OUTPATIENT_CLINIC_OR_DEPARTMENT_OTHER): Payer: Self-pay

## 2024-04-14 ENCOUNTER — Ambulatory Visit: Payer: Self-pay

## 2024-04-19 ENCOUNTER — Ambulatory Visit: Admitting: Podiatry

## 2024-05-01 ENCOUNTER — Ambulatory Visit: Admitting: Diagnostic Neuroimaging

## 2024-10-09 ENCOUNTER — Ambulatory Visit
